# Patient Record
Sex: Female | Born: 1995
Health system: Southern US, Community
[De-identification: ages and names within clinical notes are randomized; demographics above are authoritative.]

---

## 2018-06-20 ENCOUNTER — Encounter (HOSPITAL_COMMUNITY): Payer: Self-pay | Admitting: Emergency Medicine

## 2018-06-20 ENCOUNTER — Emergency Department (HOSPITAL_COMMUNITY)
Admission: EM | Admit: 2018-06-20 | Discharge: 2018-06-20 | Disposition: A | Discharge status: Home / Self Care | Payer: Medicaid Other | Attending: Emergency Medicine | Admitting: Emergency Medicine

## 2018-06-20 ENCOUNTER — Emergency Department (HOSPITAL_COMMUNITY): Payer: Medicaid Other

## 2018-06-20 DIAGNOSIS — R102 Pelvic and perineal pain: Secondary | ICD-10-CM | POA: Insufficient documentation

## 2018-06-20 DIAGNOSIS — R1030 Lower abdominal pain, unspecified: Secondary | ICD-10-CM | POA: Diagnosis present

## 2018-06-20 LAB — URINALYSIS, ROUTINE W REFLEX MICROSCOPIC
Bilirubin Urine: NEGATIVE
GLUCOSE, UA: NEGATIVE mg/dL
KETONES UR: NEGATIVE mg/dL
Leukocytes, UA: NEGATIVE
NITRITE: NEGATIVE
PROTEIN: NEGATIVE mg/dL
Specific Gravity, Urine: 1.015 (ref 1.005–1.030)
pH: 7 (ref 5.0–8.0)

## 2018-06-20 LAB — WET PREP, GENITAL
CLUE CELLS WET PREP: NONE SEEN
SPERM: NONE SEEN
Trich, Wet Prep: NONE SEEN
WBC, Wet Prep HPF POC: NONE SEEN
Yeast Wet Prep HPF POC: NONE SEEN

## 2018-06-20 LAB — I-STAT CHEM 8, ED
BUN: 11 mg/dL (ref 6–20)
CALCIUM ION: 1.15 mmol/L (ref 1.15–1.40)
Chloride: 112 mmol/L — ABNORMAL HIGH (ref 98–111)
Creatinine, Ser: 0.7 mg/dL (ref 0.44–1.00)
Glucose, Bld: 103 mg/dL — ABNORMAL HIGH (ref 70–99)
HEMATOCRIT: 38 % (ref 36.0–46.0)
Hemoglobin: 12.9 g/dL (ref 12.0–15.0)
Potassium: 4 mmol/L (ref 3.5–5.1)
Sodium: 142 mmol/L (ref 135–145)
TCO2: 19 mmol/L — AB (ref 22–32)

## 2018-06-20 LAB — CBC
HCT: 37.9 % (ref 36.0–46.0)
HEMOGLOBIN: 12.8 g/dL (ref 12.0–15.0)
MCH: 27.6 pg (ref 26.0–34.0)
MCHC: 33.8 g/dL (ref 30.0–36.0)
MCV: 81.9 fL (ref 78.0–100.0)
Platelets: 367 10*3/uL (ref 150–400)
RBC: 4.63 MIL/uL (ref 3.87–5.11)
RDW: 15 % (ref 11.5–15.5)
WBC: 5.4 10*3/uL (ref 4.0–10.5)

## 2018-06-20 LAB — I-STAT BETA HCG BLOOD, ED (MC, WL, AP ONLY): I-stat hCG, quantitative: <5 m[IU]/mL (ref <5)

## 2018-06-20 MED ORDER — IOPAMIDOL (ISOVUE-300) INJECTION 61%
INTRAVENOUS | Status: AC
  Filled 2018-06-20: qty 100

## 2018-06-20 MED ORDER — IOPAMIDOL (ISOVUE-300) INJECTION 61%
100.0000 mL | Freq: Once | INTRAVENOUS | Status: AC | PRN
  Administered 2018-06-20: 100 mL via INTRAVENOUS

## 2018-06-20 MED ORDER — IBUPROFEN 600 MG PO TABS: 600.0000 mg | ORAL_TABLET | Freq: Three times a day (TID) | ORAL | 0 refills | Status: DC | PRN

## 2018-06-20 MED ORDER — ONDANSETRON 4 MG PO TBDP: 4.0000 mg | ORAL_TABLET | Freq: Three times a day (TID) | ORAL | 0 refills | Status: DC | PRN

## 2018-06-20 MED ORDER — KETOROLAC TROMETHAMINE 30 MG/ML IJ SOLN
15.0000 mg | Freq: Once | INTRAMUSCULAR | Status: AC
  Administered 2018-06-20: 15 mg via INTRAVENOUS
  Filled 2018-06-20: qty 1

## 2018-06-20 MED ORDER — ONDANSETRON HCL 4 MG/2ML IJ SOLN
4.0000 mg | Freq: Once | INTRAMUSCULAR | Status: AC
  Administered 2018-06-20: 4 mg via INTRAVENOUS
  Filled 2018-06-20: qty 2

## 2018-06-20 MED ORDER — FENTANYL CITRATE (PF) 100 MCG/2ML IJ SOLN
50.0000 ug | Freq: Once | INTRAMUSCULAR | Status: AC
  Administered 2018-06-20: 50 ug via INTRAVENOUS
  Filled 2018-06-20: qty 2

## 2018-06-20 MED ORDER — FENTANYL CITRATE (PF) 100 MCG/2ML IJ SOLN
100.0000 ug | Freq: Once | INTRAMUSCULAR | Status: AC
  Administered 2018-06-20: 100 ug via INTRAVENOUS
  Filled 2018-06-20: qty 2

## 2018-06-20 MED ORDER — MORPHINE SULFATE (PF) 4 MG/ML IV SOLN
4.0000 mg | Freq: Once | INTRAVENOUS | Status: AC
  Administered 2018-06-20: 4 mg via INTRAVENOUS
  Filled 2018-06-20: qty 1

## 2018-06-20 NOTE — ED Provider Notes (Signed)
[facility]-EMERGENCY DEPT Provider Note   CSN: [id number] Arrival date & time: 06/20/18  0755     History   Chief Complaint Chief Complaint  Patient presents with  . Abdominal Pain  . Emesis    HPI Kayla Oneill is a 22 y.o. female.  HPI Patient presents with abdominal pain and vomiting.  Lower abdominal pain somewhat severe over the last 4 days.  Worse today.  Has had nausea and vomiting and both diarrhea and constipation.  No fevers.  He has not had pains like this before.  No dysuria.  Pain is sharp.  Worse with movements.  Denies vaginal discharge.  Does not know if she is pregnant. History reviewed. No pertinent past medical history.  There are no active problems to display for this patient.   History reviewed. No pertinent surgical history.   OB History   None      Home Medications    Prior to Admission medications   Medication Sig Start Date End Date Taking? Authorizing Provider  ibuprofen (ADVIL,MOTRIN) 600 MG tablet Take 1 tablet (600 mg total) by mouth every 8 (eight) hours as needed. 06/20/18   Benjiman Core, MD  ondansetron (ZOFRAN-ODT) 4 MG disintegrating tablet Take 1 tablet (4 mg total) by mouth every 8 (eight) hours as needed for nausea or vomiting. 06/20/18   Benjiman Core, MD    Family History No family history on file.  Social History Social History   Tobacco Use  . Smoking status: Never Smoker  . Smokeless tobacco: Never Used  Substance Use Topics  . Alcohol use: Not on file  . Drug use: Not on file     Allergies   Patient has no known allergies.   Review of Systems Review of Systems  Constitutional: Positive for appetite change.  HENT: Negative for congestion.   Respiratory: Negative for shortness of breath.   Cardiovascular: Negative for chest pain.  Gastrointestinal: Positive for abdominal pain, constipation, diarrhea, nausea and vomiting.  Genitourinary: Negative for flank pain, vaginal discharge  and vaginal pain.  Musculoskeletal: Negative for back pain.  Skin: Negative for rash.  Neurological: Negative for tremors.  Hematological: Negative for adenopathy.  Psychiatric/Behavioral: Negative for confusion.     Physical Exam Updated Vital Signs BP 108/65 (BP Location: Left Arm)   Pulse 62   Temp 97.7 F (36.5 C) (Oral)   Resp 16   LMP 06/20/2018 Comment: neg hcg 06-20-18  SpO2 100%   Physical Exam  Constitutional: She appears well-developed.  Patient appears uncomfortable  HENT:  Head: Atraumatic.  Eyes: Pupils are equal, round, and reactive to light.  Cardiovascular: Regular rhythm.  Pulmonary/Chest: Effort normal.  Abdominal: Normal appearance.  Lower abdominal tenderness without mass.  No hernia palpated.  Moderate tenderness to  Neurological: She is alert.  Skin: Skin is warm. Capillary refill takes less than 2 seconds.  Pelvic exam showed some bloody discharge.  No clear adnexal tenderness but did have pain with palpation of the uterus.   ED Treatments / Results  Labs (all labs ordered are listed, but only abnormal results are displayed) Labs Reviewed  URINALYSIS, ROUTINE W REFLEX MICROSCOPIC - Abnormal; Notable for the following components:      Result Value   Hgb urine dipstick LARGE (*)    Bacteria, UA RARE (*)    All other components within normal limits  I-STAT CHEM 8, ED - Abnormal; Notable for the following components:   Chloride 112 (*)    Glucose, Bld 103 (*)  TCO2 19 (*)    All other components within normal limits  WET PREP, GENITAL  CBC  HIV ANTIBODY (ROUTINE TESTING)  RPR  I-STAT BETA HCG BLOOD, ED ([facility], [facility], [facility] ONLY)  GC/CHLAMYDIA PROBE AMP ([place]) NOT AT [facility]    EKG None  Radiology US Transvaginal Non-ob  Result Date: 06/20/2018 CLINICAL DATA:  Pelvic pain and pressure for 4 days. EXAM: TRANSABDOMINAL AND TRANSVAGINAL ULTRASOUND OF PELVIS DOPPLER ULTRASOUND OF OVARIES TECHNIQUE: Both transabdominal and transvaginal  ultrasound examinations of the pelvis were performed. Transabdominal technique was performed for global imaging of the pelvis including uterus, ovaries, adnexal regions, and pelvic cul-de-sac. It was necessary to proceed with endovaginal exam following the transabdominal exam to visualize the endometrium and adnexa. Color and duplex Doppler ultrasound was utilized to evaluate blood flow to the ovaries. COMPARISON:  Pelvic ultrasound 12/30/2013. FINDINGS: Uterus Measurements: 7.2 x 3.7 x 4.7 cm, within normal limits. No fibroids or other mass visualized. Endometrium Thickness: 8.6 mm, within normal limits. No focal abnormality visualized. Right ovary Measurements: 3.3 x 1.8 x 2.4 cm, within normal limits. Normal appearance/no adnexal mass. Left ovary Measurements: 3.2 x 1.6 x 2.2 cm, within normal limits. Normal appearance/no adnexal mass. Pulsed Doppler evaluation of both ovaries demonstrates normal low-resistance arterial and venous waveforms. Other findings No abnormal free fluid. IMPRESSION: 1. Negative pelvic ultrasound. No acute or focal lesion to explain the patient's pelvic pain or pressure. 2. Normal sonographic appearance of the ovaries including pulsed Doppler evaluation. Arterial and venous blood flow is within normal limits. Electronically Signed   By: Marin Roberts M.D.   On: 06/20/2018 12:42   US Pelvis Complete  Result Date: 06/20/2018 CLINICAL DATA:  Pelvic pain and pressure for 4 days. EXAM: TRANSABDOMINAL AND TRANSVAGINAL ULTRASOUND OF PELVIS DOPPLER ULTRASOUND OF OVARIES TECHNIQUE: Both transabdominal and transvaginal ultrasound examinations of the pelvis were performed. Transabdominal technique was performed for global imaging of the pelvis including uterus, ovaries, adnexal regions, and pelvic cul-de-sac. It was necessary to proceed with endovaginal exam following the transabdominal exam to visualize the endometrium and adnexa. Color and duplex Doppler ultrasound was utilized to  evaluate blood flow to the ovaries. COMPARISON:  Pelvic ultrasound 12/30/2013. FINDINGS: Uterus Measurements: 7.2 x 3.7 x 4.7 cm, within normal limits. No fibroids or other mass visualized. Endometrium Thickness: 8.6 mm, within normal limits. No focal abnormality visualized. Right ovary Measurements: 3.3 x 1.8 x 2.4 cm, within normal limits. Normal appearance/no adnexal mass. Left ovary Measurements: 3.2 x 1.6 x 2.2 cm, within normal limits. Normal appearance/no adnexal mass. Pulsed Doppler evaluation of both ovaries demonstrates normal low-resistance arterial and venous waveforms. Other findings No abnormal free fluid. IMPRESSION: 1. Negative pelvic ultrasound. No acute or focal lesion to explain the patient's pelvic pain or pressure. 2. Normal sonographic appearance of the ovaries including pulsed Doppler evaluation. Arterial and venous blood flow is within normal limits. Electronically Signed   By: Marin Roberts M.D.   On: 06/20/2018 12:42   Ct Abdomen Pelvis W Contrast  Result Date: 06/20/2018 CLINICAL DATA:  Lower abdominal pain for the past 4 days. EXAM: CT ABDOMEN AND PELVIS WITH CONTRAST TECHNIQUE: Multidetector CT imaging of the abdomen and pelvis was performed using the standard protocol following bolus administration of intravenous contrast. CONTRAST:  ISOVUE-300 IOPAMIDOL (ISOVUE-300) INJECTION 61% COMPARISON:  Pelvic ultrasound-earlier same day FINDINGS: Lower chest: Limited visualization of the lower thorax demonstrates minimal dependent subpleural ground-glass atelectasis. No discrete focal airspace opacities. No pleural effusion. Normal heart size.  No pericardial effusion. Hepatobiliary: Normal hepatic contour. There is a minimal amount of focal fatty infiltration adjacent to the fissure for the ligamentum teres. No discrete hepatic lesions. Normal appearance of the gallbladder given degree of distention. No radiopaque gallstones. No intra or extrahepatic biliary ductal dilatation.  No ascites. Pancreas: Normal appearance the pancreas Spleen: Normal appearance of the spleen Adrenals/Urinary Tract: There is symmetric enhancement of the bilateral kidneys. No definite renal stones this postcontrast examination. No discrete renal lesions. No urine obstruction or perinephric stranding. Normal appearance the bilateral adrenal glands. Normal appearance of the urinary bladder given degree distention. Stomach/Bowel: Moderate colonic stool burden without evidence of enteric obstruction. Normal appearance of the terminal ileum and appendix. No pneumoperitoneum, pneumatosis or portal venous gas. Vascular/Lymphatic: Normal caliber the abdominal aorta. The major branch vessels of the abdominal aorta appear patent on this non CTA examination. No bulky retroperitoneal, mesenteric, pelvic or inguinal lymphadenopathy. Reproductive: Normal appearance of the pelvic organs. No discrete adnexal lesion. No free fluid in the pelvic cul-de-sac. Other: Tiny mesenteric fat containing periumbilical hernia. Musculoskeletal: No acute or aggressive osseous abnormalities. IMPRESSION: No explanation for patient's lower abdominal pain. Specifically, no evidence of enteric or urinary obstruction. Normal appearance of the appendix. Electronically Signed   By: Simonne Come M.D.   On: 06/20/2018 14:00   [place] Art/ven Flow Abd Pelv Doppler  Result Date: 06/20/2018 CLINICAL DATA:  Pelvic pain and pressure for 4 days. EXAM: TRANSABDOMINAL AND TRANSVAGINAL ULTRASOUND OF PELVIS DOPPLER ULTRASOUND OF OVARIES TECHNIQUE: Both transabdominal and transvaginal ultrasound examinations of the pelvis were performed. Transabdominal technique was performed for global imaging of the pelvis including uterus, ovaries, adnexal regions, and pelvic cul-de-sac. It was necessary to proceed with endovaginal exam following the transabdominal exam to visualize the endometrium and adnexa. Color and duplex Doppler ultrasound was utilized to evaluate blood flow  to the ovaries. COMPARISON:  Pelvic ultrasound 12/30/2013. FINDINGS: Uterus Measurements: 7.2 x 3.7 x 4.7 cm, within normal limits. No fibroids or other mass visualized. Endometrium Thickness: 8.6 mm, within normal limits. No focal abnormality visualized. Right ovary Measurements: 3.3 x 1.8 x 2.4 cm, within normal limits. Normal appearance/no adnexal mass. Left ovary Measurements: 3.2 x 1.6 x 2.2 cm, within normal limits. Normal appearance/no adnexal mass. Pulsed Doppler evaluation of both ovaries demonstrates normal low-resistance arterial and venous waveforms. Other findings No abnormal free fluid. IMPRESSION: 1. Negative pelvic ultrasound. No acute or focal lesion to explain the patient's pelvic pain or pressure. 2. Normal sonographic appearance of the ovaries including pulsed Doppler evaluation. Arterial and venous blood flow is within normal limits. Electronically Signed   By: Marin Roberts M.D.   On: 06/20/2018 12:42    Procedures Procedures (including critical care time)  Medications Ordered in ED Medications  ondansetron (ZOFRAN) injection 4 mg (4 mg Intravenous Given 06/20/18 0830)  fentaNYL (SUBLIMAZE) injection 100 mcg (100 mcg Intravenous Given 06/20/18 0830)  morphine 4 MG/ML injection 4 mg (4 mg Intravenous Given 06/20/18 1202)  iopamidol (ISOVUE-300) 61 % injection 100 mL (100 mLs Intravenous Contrast Given 06/20/18 1341)  ketorolac (TORADOL) 30 MG/ML injection 15 mg (15 mg Intravenous Given 06/20/18 1429)  fentaNYL (SUBLIMAZE) injection 50 mcg (50 mcg Intravenous Given 06/20/18 1510)     Initial Impression / Assessment and Plan / ED Course  I have reviewed the triage vital signs and the nursing notes.  Pertinent labs & imaging results that were available during my care of the patient were reviewed by me and considered in my medical decision  making (see chart for details).     Patient with pelvic pain and abdominal pain.  Has been vomiting.  Work-up overall reassuring.   Initially nothing to eat then tolerated some orals.  May be just menstrual pain.  Ultrasound and CT scan reassuring.  Pelvic exam benign.  Discharge home.  Final Clinical Impressions(s) / ED Diagnoses   Final diagnoses:  Pelvic pain    ED Discharge Orders         Ordered    ondansetron (ZOFRAN-ODT) 4 MG disintegrating tablet  Every 8 hours PRN     06/20/18 1459    ibuprofen (ADVIL,MOTRIN) 600 MG tablet  Every 8 hours PRN     06/20/18 1459           Benjiman CorePickering, Racine Erby, MD 06/21/18 1554

## 2018-06-20 NOTE — ED Triage Notes (Signed)
Pt c/o lower abd pains x 4 days but worse the past 3 hours. Reports n/v.

## 2018-06-20 NOTE — ED Notes (Signed)
Pt requesting something to drink. Aware she is nothing by mouth at this time per MD Pickering.

## 2018-06-21 LAB — HIV ANTIBODY (ROUTINE TESTING W REFLEX): HIV SCREEN 4TH GENERATION: NONREACTIVE

## 2018-06-21 LAB — RPR: RPR Ser Ql: NONREACTIVE

## 2018-06-22 LAB — GC/CHLAMYDIA PROBE AMP (~~LOC~~) NOT AT ARMC
CHLAMYDIA, DNA PROBE: NEGATIVE
NEISSERIA GONORRHEA: NEGATIVE

## 2018-10-04 ENCOUNTER — Emergency Department (HOSPITAL_BASED_OUTPATIENT_CLINIC_OR_DEPARTMENT_OTHER)
Admission: EM | Admit: 2018-10-04 | Discharge: 2018-10-04 | Disposition: A | Discharge status: Home / Self Care | Payer: Medicaid Other | Attending: Emergency Medicine | Admitting: Emergency Medicine

## 2018-10-04 ENCOUNTER — Encounter (HOSPITAL_BASED_OUTPATIENT_CLINIC_OR_DEPARTMENT_OTHER): Payer: Self-pay | Admitting: *Deleted

## 2018-10-04 ENCOUNTER — Emergency Department (HOSPITAL_BASED_OUTPATIENT_CLINIC_OR_DEPARTMENT_OTHER): Payer: Medicaid Other

## 2018-10-04 ENCOUNTER — Other Ambulatory Visit: Payer: Self-pay

## 2018-10-04 DIAGNOSIS — J40 Bronchitis, not specified as acute or chronic: Secondary | ICD-10-CM | POA: Diagnosis not present

## 2018-10-04 DIAGNOSIS — J069 Acute upper respiratory infection, unspecified: Secondary | ICD-10-CM | POA: Insufficient documentation

## 2018-10-04 DIAGNOSIS — F17200 Nicotine dependence, unspecified, uncomplicated: Secondary | ICD-10-CM | POA: Insufficient documentation

## 2018-10-04 DIAGNOSIS — R05 Cough: Secondary | ICD-10-CM | POA: Diagnosis present

## 2018-10-04 MED ORDER — FLUTICASONE PROPIONATE 50 MCG/ACT NA SUSP: 1.0000 | Freq: Every day | NASAL | 0 refills | Status: DC

## 2018-10-04 MED ORDER — PREDNISONE 20 MG PO TABS: 40.0000 mg | ORAL_TABLET | Freq: Every day | ORAL | 0 refills | Status: DC

## 2018-10-04 MED ORDER — ONDANSETRON 4 MG PO TBDP: 4.0000 mg | ORAL_TABLET | Freq: Three times a day (TID) | ORAL | 0 refills | Status: DC | PRN

## 2018-10-04 MED ORDER — PREDNISONE 20 MG PO TABS: 40.0000 mg | ORAL_TABLET | Freq: Every day | ORAL | 0 refills | Status: AC

## 2018-10-04 MED ORDER — IBUPROFEN 600 MG PO TABS: 600.0000 mg | ORAL_TABLET | Freq: Three times a day (TID) | ORAL | 0 refills | Status: DC | PRN

## 2018-10-04 MED FILL — FLUTICASONE PROP 50 MCG SPR: 50 | 60 days supply | Qty: 16 | Fill #0

## 2018-10-04 MED FILL — predniSONE 20 MG TABS: 20 | 4 days supply | Qty: 8 | Fill #0

## 2018-10-04 NOTE — ED Triage Notes (Signed)
Dry cough and bodyaches x 1 week.

## 2018-10-04 NOTE — Discharge Instructions (Addendum)
You likely have a viral illness.  This should be treated symptomatically. Take prednisone daily. Use Tylenol or ibuprofen as needed for fevers or body aches. Use Flonase daily for nasal congestion and cough. Use over the counter cough drops and syrups as needed.  Make sure you stay well-hydrated with water. Wash your hands frequently to prevent spread of infection. Return to the emergency room if you develop chest pain, difficulty breathing, or any new or worsening symptoms.

## 2018-10-04 NOTE — ED Notes (Signed)
Patient transported to X-ray 

## 2018-10-04 NOTE — ED Provider Notes (Signed)
[facility] EMERGENCY DEPARTMENT Provider Note   CSN: [id number] Arrival date & time: 10/04/18  1154     History   Chief Complaint Chief Complaint  Patient presents with  . Cough    HPI Kayla Oneill is a 22 y.o. female presenting for evaluation of cough, sore throat, body aches.  Patient states for the past week, she has been having cough.  She has associated generalized body aches and sore throat.  She has nasal congestion and drainage. She reports chills without fevers.  Cough was nonproductive until today, when she started producing mucus with blood streaks.  She vomited once today.  She denies current nausea or vomiting.  She denies ear pain, chest pain, shortness of breath, abdominal pain, urinary symptoms, normal bowel movements.  She smoke cigarettes, denies history of asthma or COPD.  She denies sick contacts.  She has not taken anything for her symptoms including Tylenol, ibuprofen, or over the counter cough syrup.  HPI  History reviewed. No pertinent past medical history.  There are no active problems to display for this patient.   History reviewed. No pertinent surgical history.   OB History   No obstetric history on file.      Home Medications    Prior to Admission medications   Medication Sig Start Date End Date Taking? Authorizing Provider  fluticasone (FLONASE) 50 MCG/ACT nasal spray Place 1 spray into both nostrils daily. 10/04/18   Lisett Dirusso, PA-C  predniSONE (DELTASONE) 20 MG tablet Take 2 tablets (40 mg total) by mouth daily for 4 days. 10/04/18 10/08/18  Sheranda Seabrooks, PA-C    Family History No family history on file.  Social History Social History   Tobacco Use  . Smoking status: Current Every Day Smoker  . Smokeless tobacco: Never Used  Substance Use Topics  . Alcohol use: Not Currently  . Drug use: Never     Allergies   Patient has no known allergies.   Review of Systems Review of Systems  Constitutional:  Positive for chills.  HENT: Positive for congestion and sore throat.   Respiratory: Positive for cough.   Gastrointestinal: Positive for vomiting (x1, resolved).     Physical Exam Updated Vital Signs BP 113/79   Pulse 94   Temp 99.2 F (37.3 C) (Oral)   Resp 17   Ht 5\' 3"  (1.6 m)   Wt 78 kg   LMP 09/04/2018 (Within Days) Comment: she does not know the date of her LMP  SpO2 100%   BMI 30.47 kg/m   Physical Exam Vitals signs and nursing note reviewed.  Constitutional:      General: She is not in acute distress.    Appearance: She is well-developed.     Comments: Appears nontoxic  HENT:     Head: Normocephalic and atraumatic.     Comments: OP mildly erythematous without tonsillar swelling or exudate.  Uvula midline with palate rise.  TMs nonerythematous and a bulge bilaterally.  Uvula midline with palate rise. Nasal congestion noted    Right Ear: Tympanic membrane, ear canal and external ear normal.     Left Ear: Tympanic membrane, ear canal and external ear normal.     Nose: Mucosal edema and congestion present.     Right Sinus: No maxillary sinus tenderness or frontal sinus tenderness.     Left Sinus: No maxillary sinus tenderness or frontal sinus tenderness.     Mouth/Throat:     Pharynx: Uvula midline. Posterior oropharyngeal erythema present.  Tonsils: No tonsillar exudate.  Eyes:     Conjunctiva/sclera: Conjunctivae normal.     Pupils: Pupils are equal, round, and reactive to light.  Neck:     Musculoskeletal: Normal range of motion.  Cardiovascular:     Rate and Rhythm: Normal rate and regular rhythm.  Pulmonary:     Effort: Pulmonary effort is normal.     Breath sounds: Normal breath sounds. No decreased breath sounds, wheezing, rhonchi or rales.     Comments: Speaking in full sentences.  Clear lung sounds in all fields. Abdominal:     General: There is no distension.     Palpations: Abdomen is soft.     Tenderness: There is no abdominal tenderness.    Musculoskeletal: Normal range of motion.  Lymphadenopathy:     Cervical: No cervical adenopathy.  Skin:    General: Skin is warm.     Capillary Refill: Capillary refill takes less than 2 seconds.  Neurological:     Mental Status: She is alert and oriented to person, place, and time.      ED Treatments / Results  Labs (all labs ordered are listed, but only abnormal results are displayed) Labs Reviewed - No data to display  EKG None  Radiology Dg Chest 2 View  Result Date: 10/04/2018 CLINICAL DATA:  Cough for 1 week EXAM: CHEST - 2 VIEW COMPARISON:  None. FINDINGS: Lungs are clear. Heart size and pulmonary vascularity are normal. No adenopathy. No pneumothorax. No bone lesions. IMPRESSION: No edema or consolidation. Electronically Signed   By: Bretta Bang III M.D.   On: 10/04/2018 13:15    Procedures Procedures (including critical care time)  Medications Ordered in ED Medications - No data to display   Initial Impression / Assessment and Plan / ED Course  I have reviewed the triage vital signs and the nursing notes.  Pertinent labs & imaging results that were available during my care of the patient were reviewed by me and considered in my medical decision making (see chart for details).     Patient presenting with 1 wk h/o URI symptoms.  Physical exam reassuring, patient is afebrile and appears nontoxic.  Pulmonary exam reassuring.  However, as her symptoms worsened today/changed today, will obtain x-ray for further evaluation.  X-ray viewed interpreted by me, no pneumonia, pneumothorax, effusion, or cardiomegaly.  Likely viral URI, consider bronchitis.  Will treat with prednisone, Flonase, and over-the-counter cough syrups.  At this time, patient appears safe for discharge.  Return precautions given.  Patient states she understands agrees plan.  Final Clinical Impressions(s) / ED Diagnoses   Final diagnoses:  Upper respiratory tract infection, unspecified type   Bronchitis    ED Discharge Orders         Ordered    fluticasone (FLONASE) 50 MCG/ACT nasal spray  Daily,   Status:  Discontinued     10/04/18 1340    predniSONE (DELTASONE) 20 MG tablet  Daily,   Status:  Discontinued     10/04/18 1340    ibuprofen (ADVIL,MOTRIN) 600 MG tablet  Every 8 hours PRN,   Status:  Discontinued     10/04/18 1358    ondansetron (ZOFRAN-ODT) 4 MG disintegrating tablet  Every 8 hours PRN,   Status:  Discontinued     10/04/18 1358    predniSONE (DELTASONE) 20 MG tablet  Daily     10/04/18 1359    fluticasone (FLONASE) 50 MCG/ACT nasal spray  Daily     10/04/18 1359  Alveria ApleyCaccavale, Rosie Torrez, PA-C 10/04/18 1607    Vanetta MuldersZackowski, Scott, MD 10/05/18 [phone]

## 2018-10-18 NOTE — Progress Notes (Deleted)
Patient ID: Verdell FaceJada Oneill, female   DOB: 11-Jul-1996, 22 y.o.   MRN: [id number]  After being seen in the ED 10/04/2018 for URI.  She was treated with prednisone, flonase, and advised to take OTC cold meds.

## 2018-10-19 ENCOUNTER — Inpatient Hospital Stay: Payer: Medicaid Other

## 2019-09-27 IMAGING — US US ART/VEN ABD/PELV/SCROTUM DOPPLER LTD
1 series · 13 of 25 positions shown · non-contrast
Comparison: Pelvic ultrasound 12/30/2013.

CLINICAL DATA: Pelvic pain and pressure for 4 days.

EXAM:
TRANSABDOMINAL AND TRANSVAGINAL ULTRASOUND OF PELVIS
DOPPLER ULTRASOUND OF OVARIES
TECHNIQUE: Both transabdominal and transvaginal ultrasound examinations of the
pelvis were performed. Transabdominal technique was performed for
global imaging of the pelvis including uterus, ovaries, adnexal
regions, and pelvic cul-de-sac.
It was necessary to proceed with endovaginal exam following the
transabdominal exam to visualize the endometrium and adnexa. Color
and duplex Doppler ultrasound was utilized to evaluate blood flow to
the ovaries.

[Series 1: us art/ven abd/pelv/scrotum doppler ltd · 0.20mm/px · 13 of 78 slices shown]
[im 1/78]
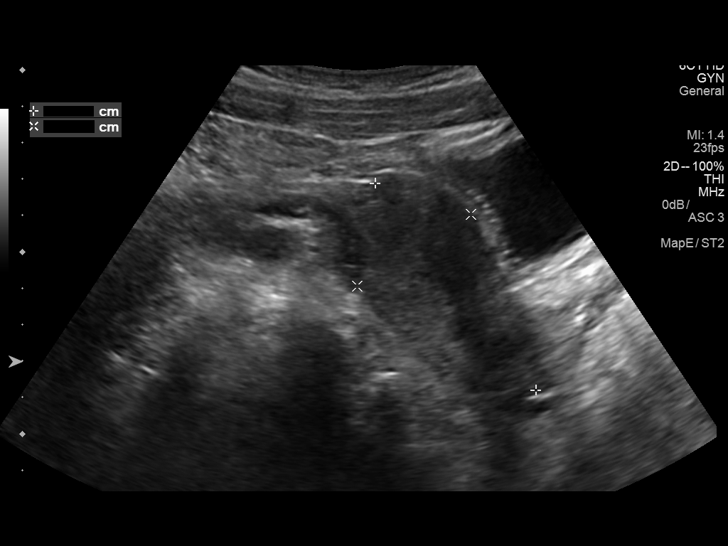
[im 7/78]
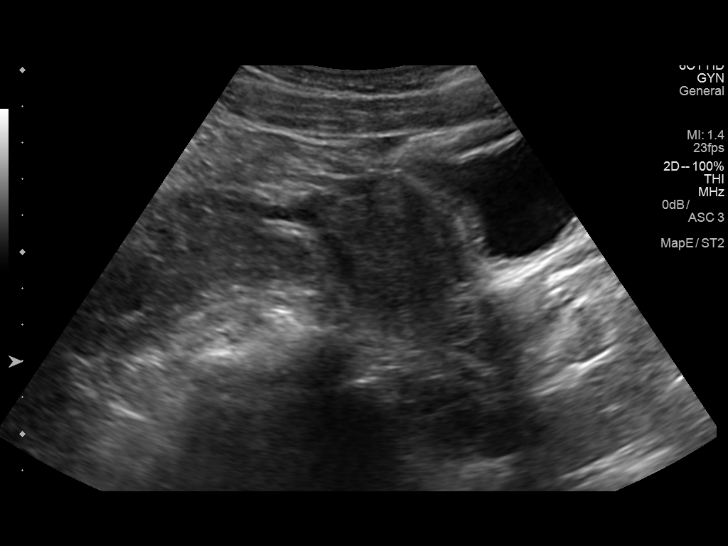
[im 13/78]
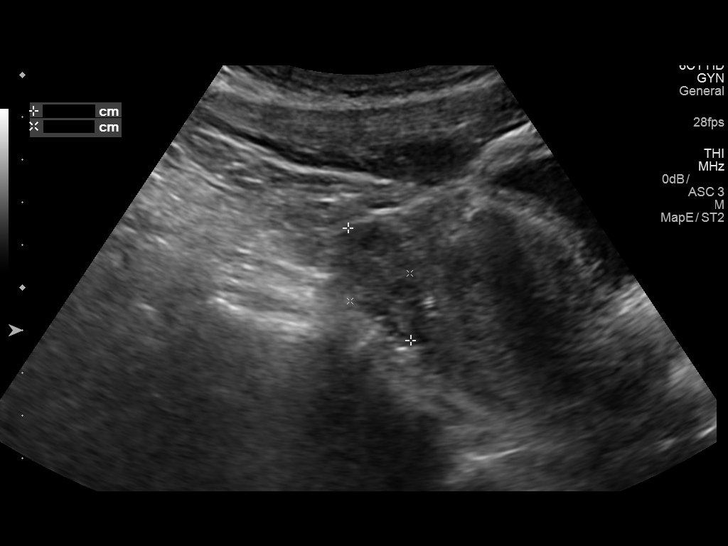
[im 20/78]
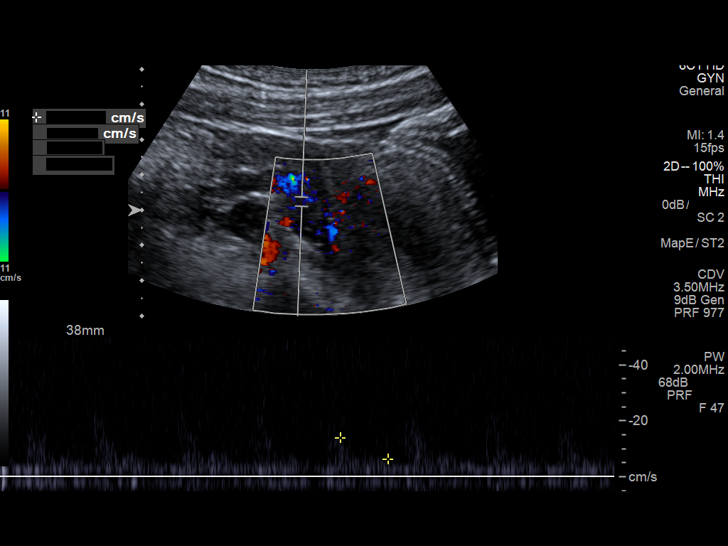
[im 26/78]
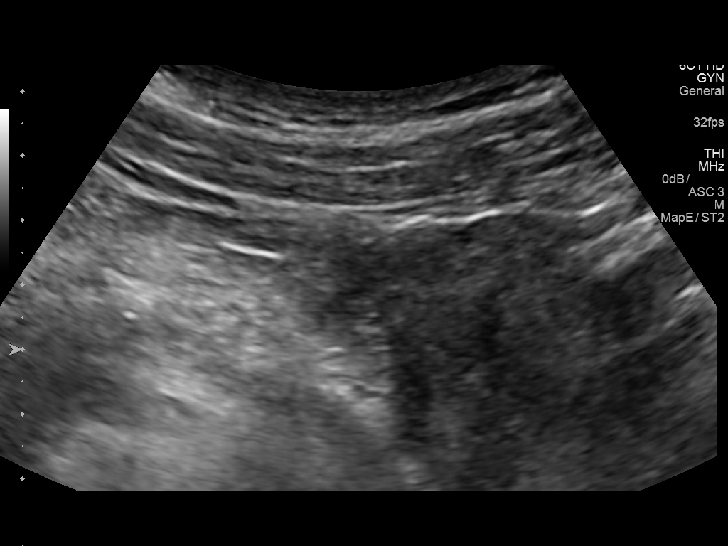
[im 33/78]
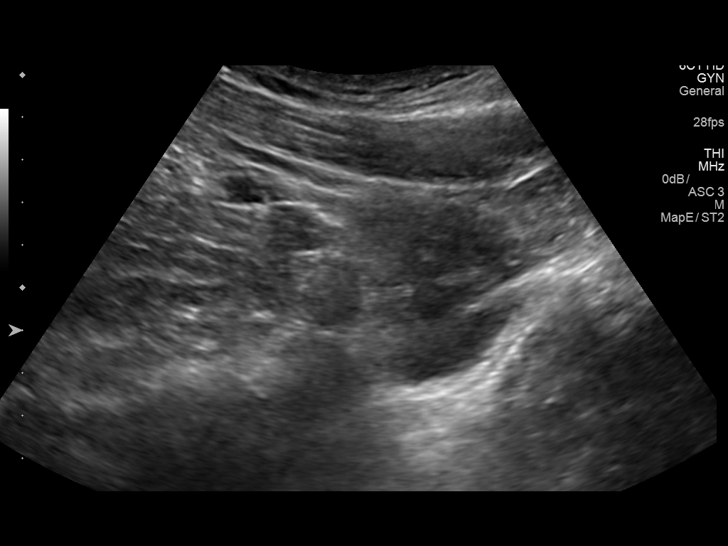
[im 39/78]
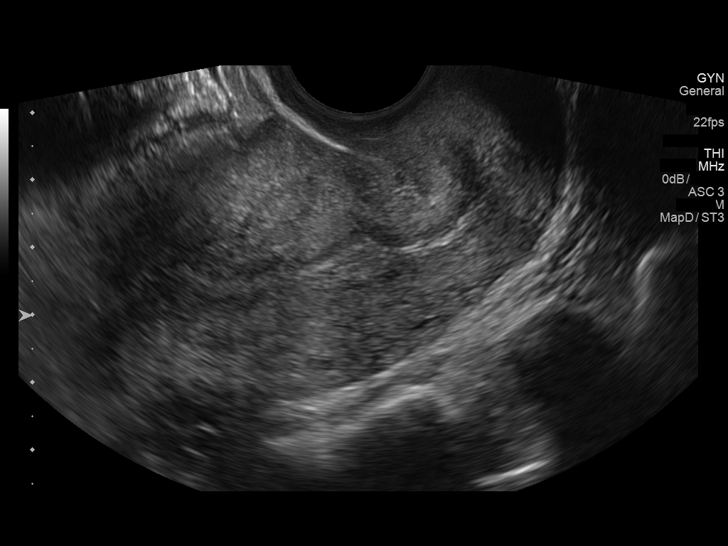
[im 45/78]
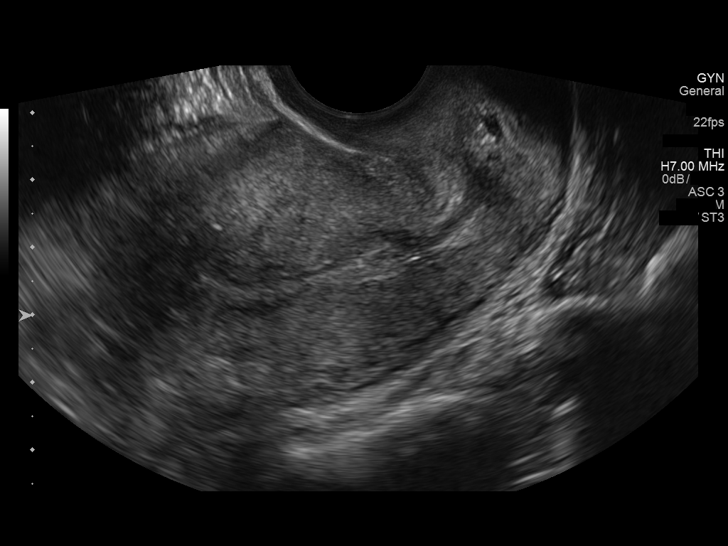
[im 52/78]
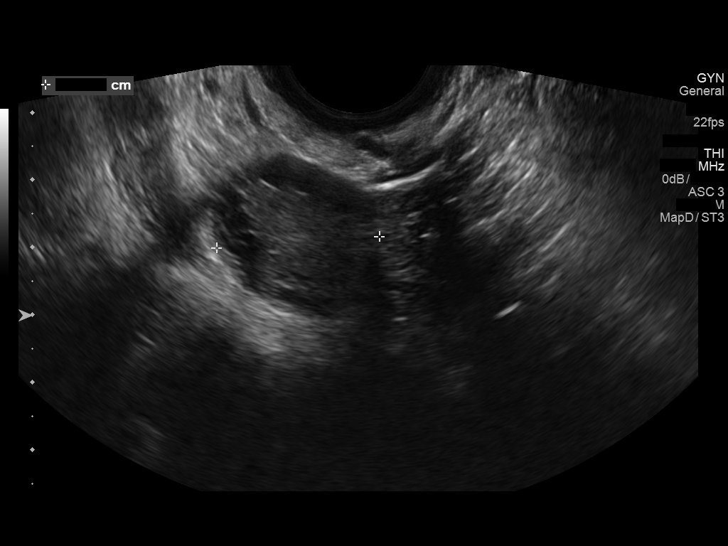
[im 58/78]
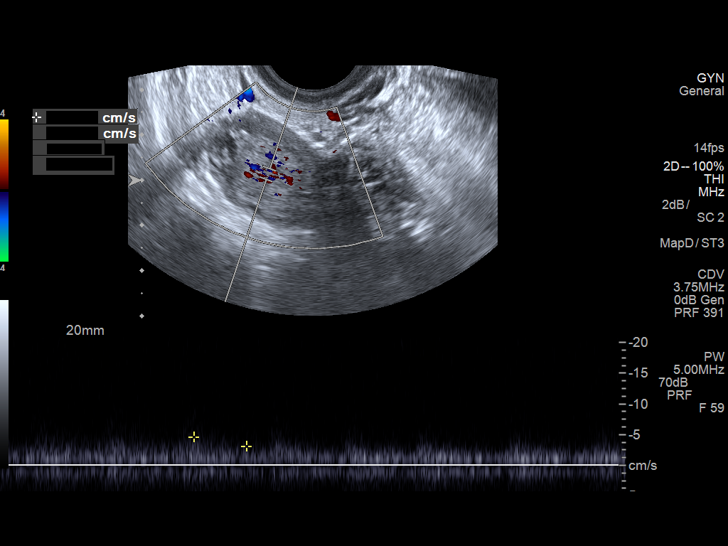
[im 65/78]
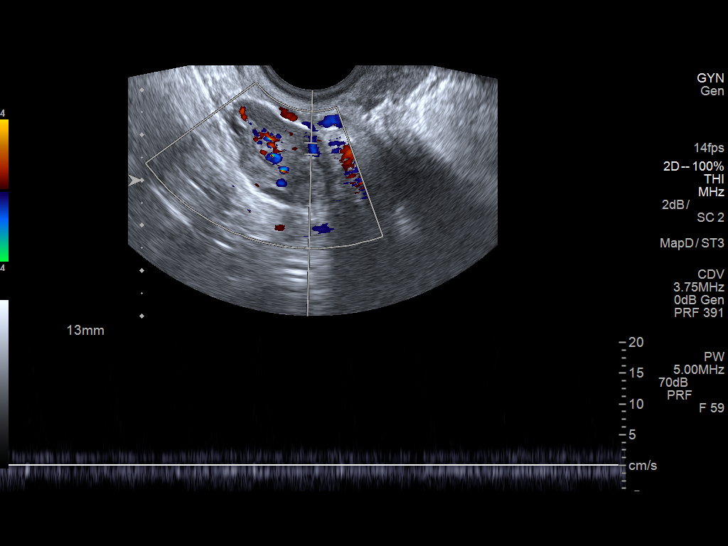
[im 71/78]
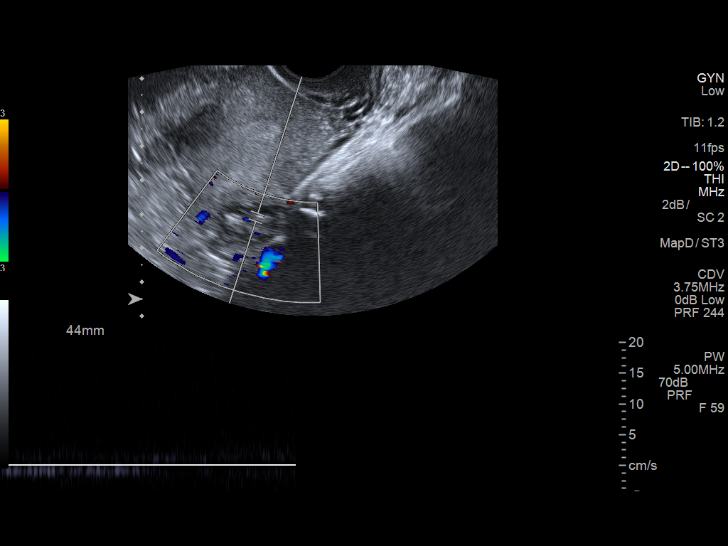
[im 78/78]
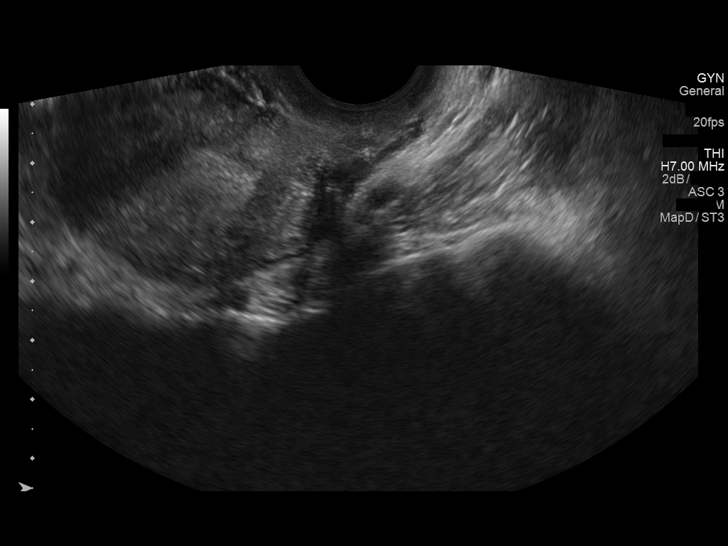

[13 of 25 positions shown; findings below may reference images not displayed]

FINDINGS: Uterus

Measurements: 7.2 x 3.7 x 4.7 cm, within normal limits. No fibroids
or other mass visualized.

Endometrium

Thickness: 8.6 mm, within normal limits. No focal abnormality
visualized.

Right ovary

Measurements: 3.3 x 1.8 x 2.4 cm, within normal limits. Normal
appearance/no adnexal mass.

Left ovary

Measurements: 3.2 x 1.6 x 2.2 cm, within normal limits. Normal
appearance/no adnexal mass.

Pulsed Doppler evaluation of both ovaries demonstrates normal
low-resistance arterial and venous waveforms.

Other findings

No abnormal free fluid.
IMPRESSION: 1. Negative pelvic ultrasound. No acute or focal lesion to explain
the patient's pelvic pain or pressure.
2. Normal sonographic appearance of the ovaries including pulsed
Doppler evaluation. Arterial and venous blood flow is within normal
limits.

## 2020-12-02 IMAGING — CR DG CHEST 2V
2 series · 2 of 2 positions shown · non-contrast
Comparison: None.

CLINICAL DATA: Cough for 1 week

EXAM:
CHEST - 2 VIEW

[w chest pa]
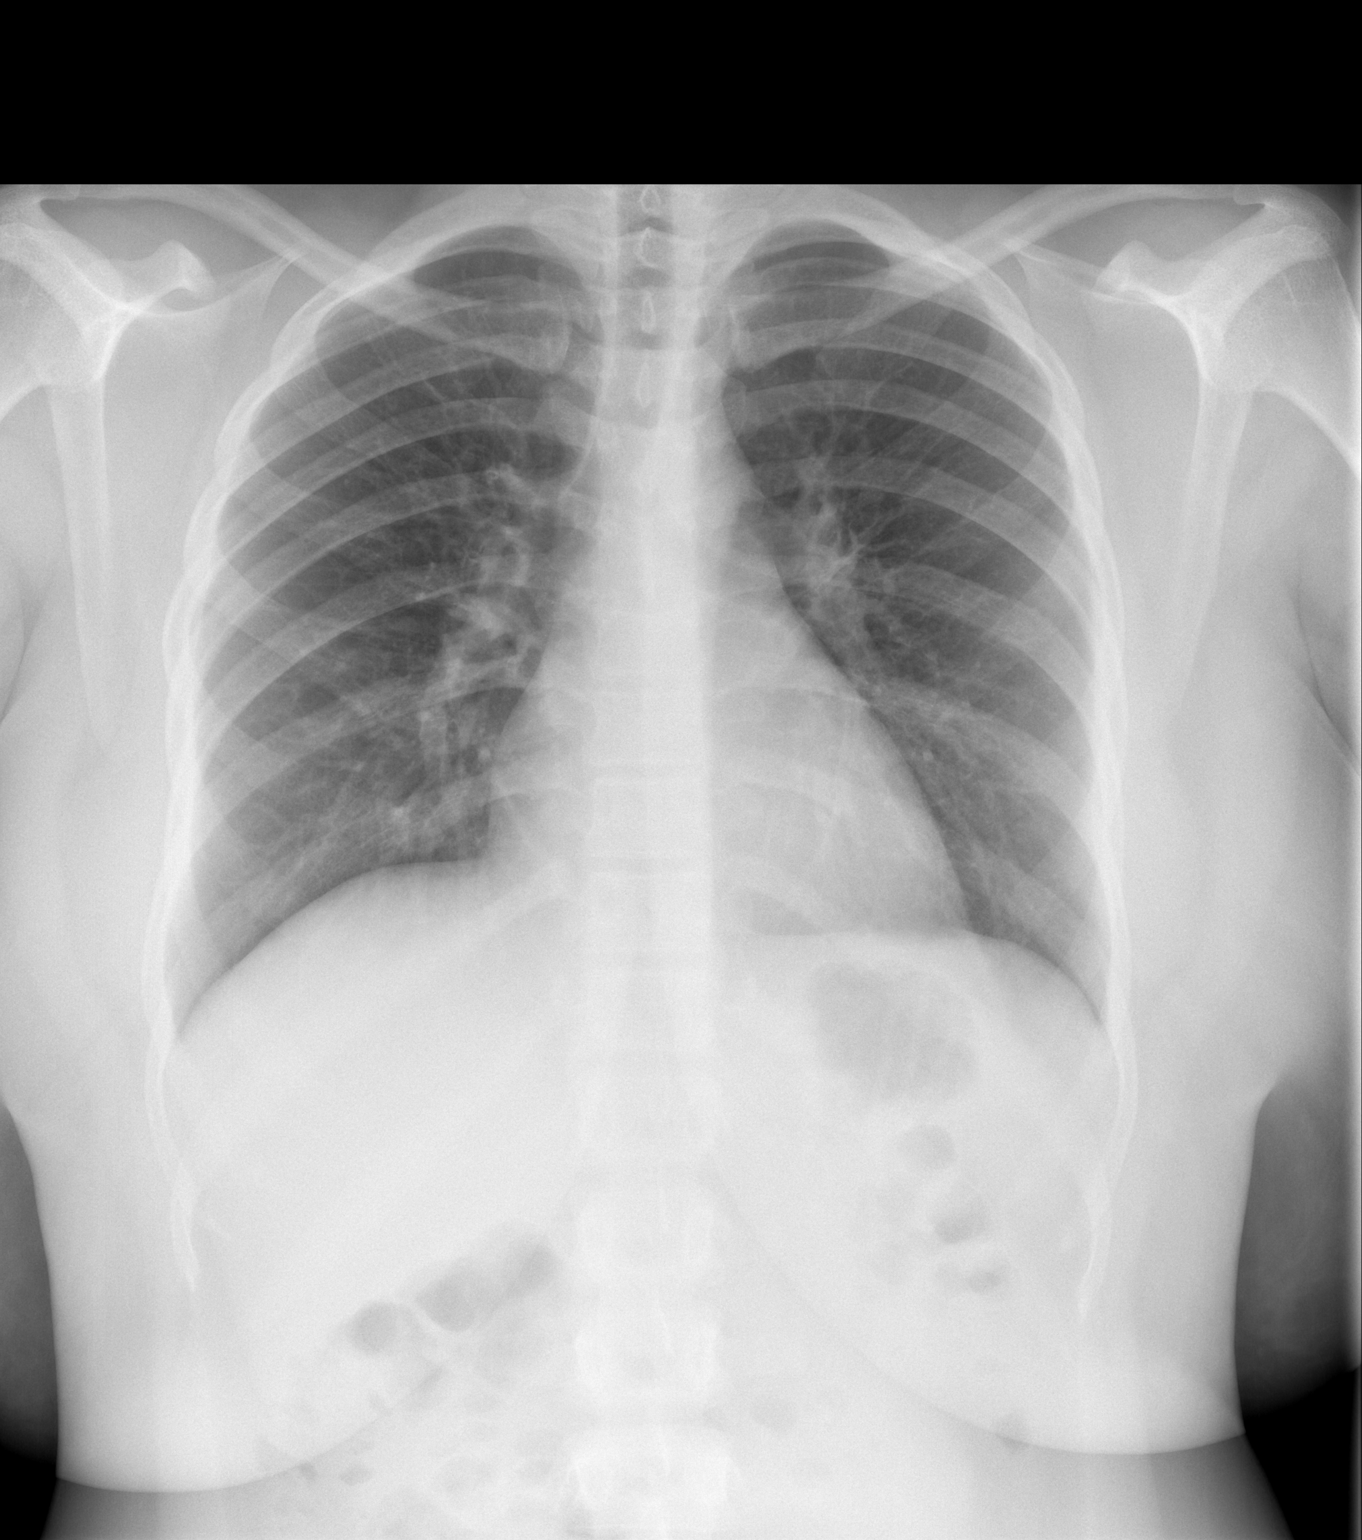

[w chest lat]
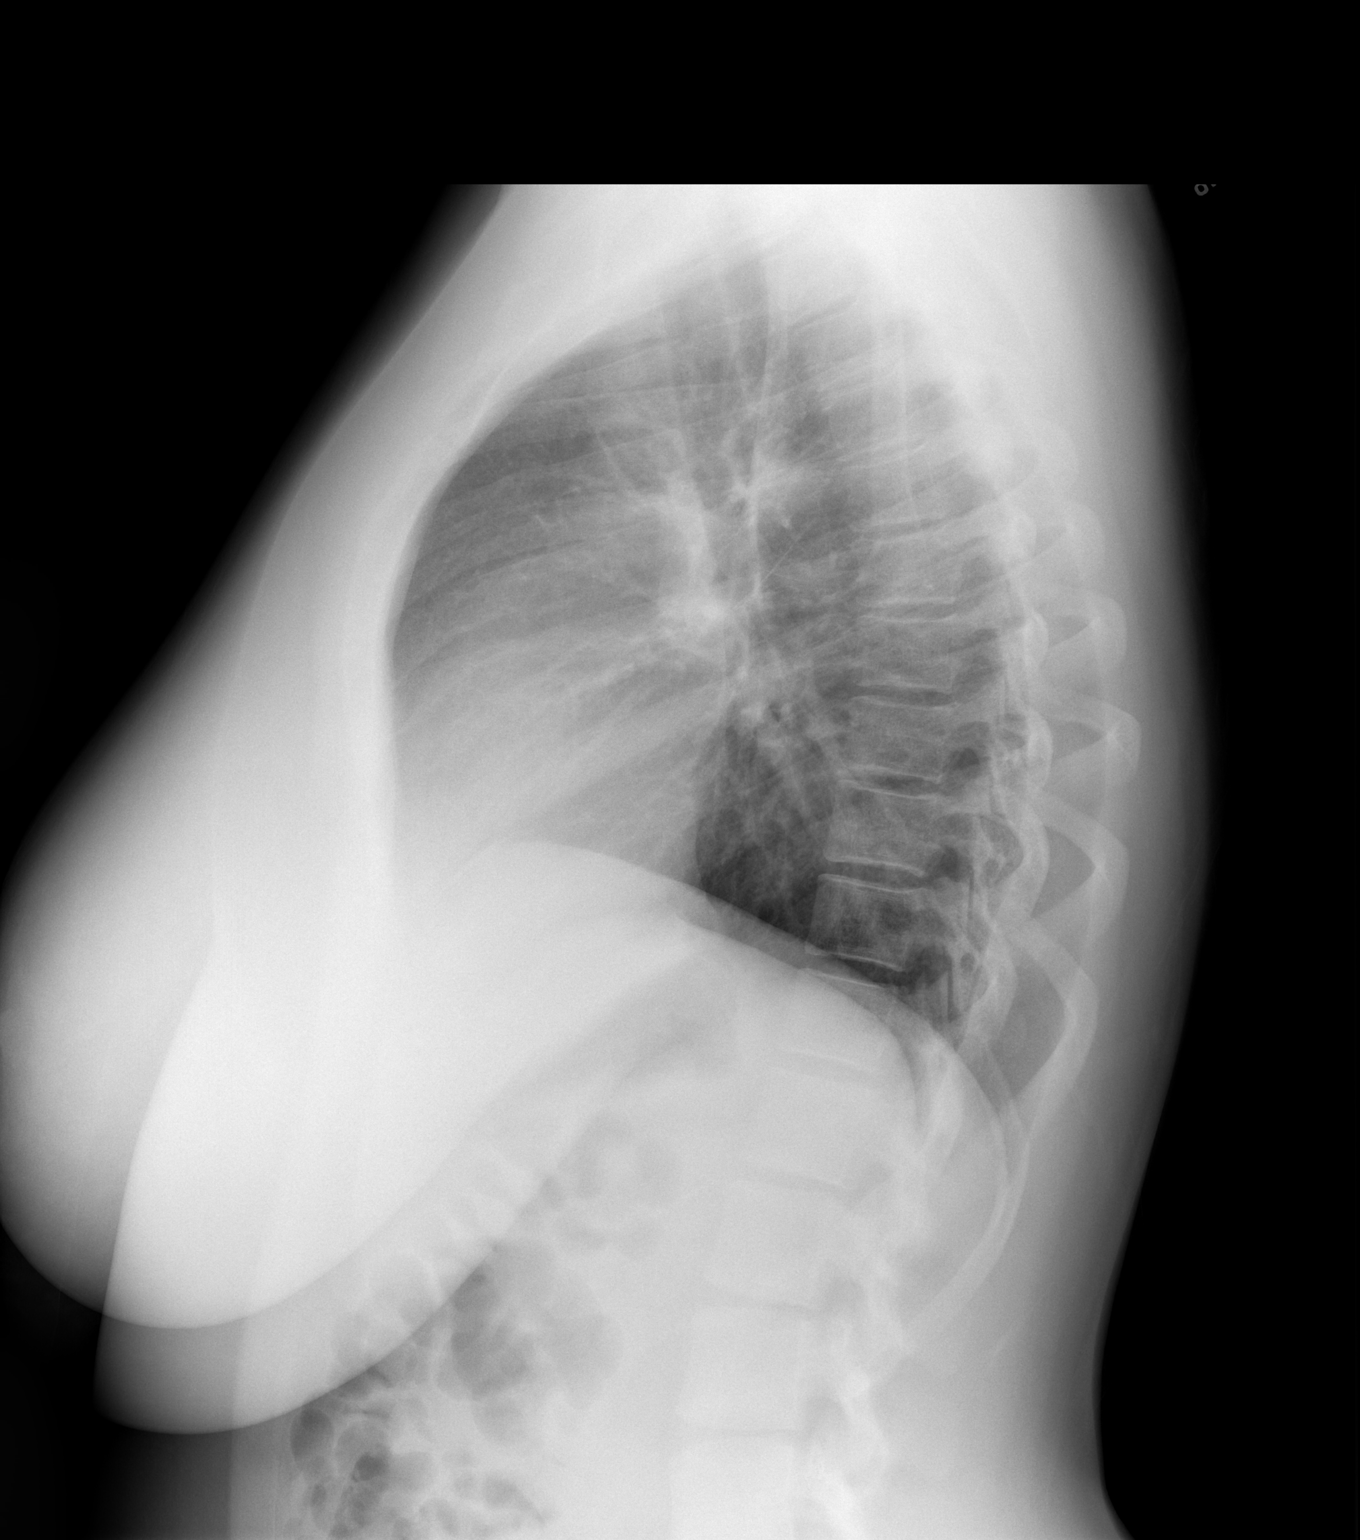

[2 of 2 positions shown; findings below may reference images not displayed]

FINDINGS: Lungs are clear. Heart size and pulmonary vascularity are normal. No
adenopathy. No pneumothorax. No bone lesions.
IMPRESSION: No edema or consolidation.

## 2021-03-16 ENCOUNTER — Emergency Department (HOSPITAL_BASED_OUTPATIENT_CLINIC_OR_DEPARTMENT_OTHER)
Admission: EM | Admit: 2021-03-16 | Discharge: 2021-03-16 | Disposition: A | Discharge status: Home / Self Care | Payer: Medicaid Other | Attending: Emergency Medicine | Admitting: Emergency Medicine

## 2021-03-16 ENCOUNTER — Encounter (HOSPITAL_BASED_OUTPATIENT_CLINIC_OR_DEPARTMENT_OTHER): Payer: Self-pay

## 2021-03-16 ENCOUNTER — Other Ambulatory Visit: Payer: Self-pay

## 2021-03-16 DIAGNOSIS — Z87891 Personal history of nicotine dependence: Secondary | ICD-10-CM | POA: Insufficient documentation

## 2021-03-16 DIAGNOSIS — U071 COVID-19: Secondary | ICD-10-CM

## 2021-03-16 LAB — RESP PANEL BY RT-PCR (FLU A&B, COVID) ARPGX2
Influenza A by PCR: NEGATIVE
Influenza B by PCR: NEGATIVE
SARS Coronavirus 2 by RT PCR: POSITIVE — AB

## 2021-03-16 LAB — GROUP A STREP BY PCR: Group A Strep by PCR: NOT DETECTED

## 2021-03-16 MED ORDER — ACETAMINOPHEN 160 MG/5ML PO SOLN
650.0000 mg | Freq: Once | ORAL | Status: AC
  Administered 2021-03-16: 650 mg via ORAL
  Filled 2021-03-16 (×2): qty 20.3

## 2021-03-16 NOTE — ED Triage Notes (Signed)
Pt c/o flu like sx x 4-5 days-NAD-steady gait

## 2021-03-16 NOTE — Discharge Instructions (Signed)
You tested positive for COVID-19.  This is likely the cause of your fevers, body aches, nausea, diminished appetite, headache, and fatigue.  Please check your temperature regularly and take Tylenol as needed for fever control.  I recommend you increase oral hydration and eat meals regularly.  I would like for you to maintain isolation precautions for another 4 days before returning to work.  When you return to work, please wear a mask for an additional 3 to 5 days.  Follow-up with a primary care provider for ongoing evaluation and management.  If you do not have one, please get established with one as soon as possible.  Return to the ED or seek immediate medical attention should you experience any new or worsening symptoms.

## 2021-03-16 NOTE — ED Provider Notes (Signed)
[facility] EMERGENCY DEPARTMENT Provider Note   CSN: [id number] Arrival date & time: 03/16/21  1601     History Chief Complaint  Patient presents with  . Cough    Marylouise Mallet is a 25 y.o. female with no past medical history presents the ED with complaints of cold symptoms.  Patient reports that 3 days ago she developed a headache with subjective fevers and chills.  She has felt particularly fatigued over the course of the past few days and with diminished appetite.  She had 1 episode of nausea and vomiting.  She is also now endorsing generalized body aches, most notably in her legs.  She denies any obvious sick contacts, chest pain or difficulty breathing, cough, congestion, abdominal pain, sore throat, difficulty eating or drinking, continued nausea symptoms, dysuria or increased urinary frequency, or changes in her bowel habits.  She states that she has essentially been sleeping at home the past couple of days and now is finally feeling improved to the point that she can come to the ED for evaluation.  She had not been checking her temperature regularly or taking any medications.  She tells me that she is ready to go home as soon as I had entered the room.  HPI     History reviewed. No pertinent past medical history.  There are no problems to display for this patient.   History reviewed. No pertinent surgical history.   OB History   No obstetric history on file.     No family history on file.  Social History   Tobacco Use  . Smoking status: Former Games developer  . Smokeless tobacco: Never Used  Vaping Use  . Vaping Use: Never used  Substance Use Topics  . Alcohol use: Yes    Comment: occ  . Drug use: Never    Home Medications Prior to Admission medications   Not on File    Allergies    Patient has no known allergies.  Review of Systems   Review of Systems  All other systems reviewed and are negative.   Physical Exam Updated Vital Signs BP  126/89 (BP Location: Left Arm)   Pulse 90   Temp (!) 100.4 F (38 C) (Oral)   Resp 20   Ht 5\' 4"  (1.626 m)   LMP 02/20/2021   SpO2 100%   BMI 29.52 kg/m   Physical Exam Vitals and nursing note reviewed. Exam conducted with a chaperone present.  Constitutional:      General: She is not in acute distress.    Appearance: Normal appearance. She is ill-appearing. She is not toxic-appearing.  HENT:     Head: Normocephalic and atraumatic.     Mouth/Throat:     Pharynx: Oropharynx is clear. No oropharyngeal exudate or posterior oropharyngeal erythema.  Eyes:     General: No scleral icterus.    Conjunctiva/sclera: Conjunctivae normal.  Cardiovascular:     Rate and Rhythm: Normal rate.     Pulses: Normal pulses.  Pulmonary:     Effort: Pulmonary effort is normal. No respiratory distress.     Breath sounds: Normal breath sounds.     Comments: CTA bilaterally.  No increased work of breathing.  100% on room air. Abdominal:     General: Abdomen is flat. There is no distension.     Palpations: Abdomen is soft.     Tenderness: There is abdominal tenderness.     Comments: Patient with mild generalized abdominal tenderness to palpation, nonspecific.  Nonfocal.  Soft, nondistended.  No peritoneal signs.  No guarding.  Musculoskeletal:        General: Normal range of motion.     Right lower leg: No edema.     Left lower leg: No edema.  Skin:    General: Skin is dry.  Neurological:     Mental Status: She is alert and oriented to person, place, and time.     GCS: GCS eye subscore is 4. GCS verbal subscore is 5. GCS motor subscore is 6.  Psychiatric:        Mood and Affect: Mood normal.        Behavior: Behavior normal.        Thought Content: Thought content normal.     ED Results / Procedures / Treatments   Labs (all labs ordered are listed, but only abnormal results are displayed) Labs Reviewed  RESP PANEL BY RT-PCR (FLU A&B, COVID) ARPGX2 - Abnormal; Notable for the following  components:      Result Value   SARS Coronavirus 2 by RT PCR POSITIVE (*)    All other components within normal limits  GROUP A STREP BY PCR    EKG None  Radiology No results found.  Procedures Procedures   Medications Ordered in ED Medications  acetaminophen (TYLENOL) 160 MG/5ML solution 650 mg (650 mg Oral Given 03/16/21 1626)    ED Course  I have reviewed the triage vital signs and the nursing notes.  Pertinent labs & imaging results that were available during my care of the patient were reviewed by me and considered in my medical decision making (see chart for details).    MDM Rules/Calculators/A&P                          Zsofia Prout was evaluated in Emergency Department on 03/16/2021 for the symptoms described in the history of present illness. She was evaluated in the context of the global COVID-19 pandemic, which necessitated consideration that the patient might be at risk for infection with the SARS-CoV-2 virus that causes COVID-19. Institutional protocols and algorithms that pertain to the evaluation of patients at risk for COVID-19 are in a state of rapid change based on information released by regulatory bodies including the CDC and federal and state organizations. These policies and algorithms were followed during the patient's care in the ED.  I personally reviewed patient's medical chart and all notes from triage and staff during today's encounter. I have also ordered and reviewed all labs and imaging that I felt to be medically necessary in the evaluation of this patient's complaints and with consideration of their physical exam. If needed, translation services were available and utilized.   Patient with symptoms consistent with viral illness for 4 days.  COVID-19 and influenza testing is obtained and pending.  Given brevity of illness and reassuring physical exam, do not feel as though imaging of chest is warranted.  Their symptoms are likely of viral etiology  and we discussed that antibiotics are not indicated for viral infections.  Patient will be discharged with symptomatic treatment.  Patient is tolerating food and liquid without difficulty and I do not believe that laboratory work-up would yield any significant findings.  Low suspicion for electrolyte derangement, but emphasized the importance of eating regularly.  I also emphasized the importance of rest, continued oral hydration, and antipyretics as needed for fever control.   Patient was positive for COVID-19.  She is without any significant risk  factors for poor outcomes.  She does not meet criteria for COVID-19 treatment.  She can follow-up with primary care for ongoing evaluation and management.  ER return precautions discussed.  They were provided opportunity to ask any additional questions and have none at this time.  Prior to discharge patient is feeling well, agreeable with plan for discharge home.  They have expressed understanding of verbal discharge instructions as well as return precautions and are agreeable to the plan.    Final Clinical Impression(s) / ED Diagnoses Final diagnoses:  COVID-19    Rx / DC Orders ED Discharge Orders    None       Lorelee New, PA-C 03/16/21 1723    Pollyann Savoy, MD 03/16/21 2224

## 2021-03-17 ENCOUNTER — Telehealth: Payer: Self-pay | Admitting: Infectious Diseases

## 2021-03-17 NOTE — Telephone Encounter (Signed)
Called to discuss with patient about COVID-19 symptoms and the use of one of the available treatments for those with mild to moderate Covid symptoms and at a high risk of hospitalization.  Pt appears to qualify for outpatient treatment due to co-morbid conditions and/or a member of an at-risk group in accordance with the FDA Emergency Use Authorization.    Symptom onset: 5/21 Vaccinated: unknown Booster?  Immunocompromised? no Qualifiers: BMI 29 NIH Criteria: 2  Unable to reach pt - unable to LVM. Mychart sent    Rexene Alberts

## 2021-05-15 ENCOUNTER — Emergency Department (HOSPITAL_BASED_OUTPATIENT_CLINIC_OR_DEPARTMENT_OTHER)
Admission: EM | Admit: 2021-05-15 | Discharge: 2021-05-15 | Disposition: A | Discharge status: Home / Self Care | Payer: Medicaid Other | Attending: Emergency Medicine | Admitting: Emergency Medicine

## 2021-05-15 ENCOUNTER — Encounter (HOSPITAL_BASED_OUTPATIENT_CLINIC_OR_DEPARTMENT_OTHER): Payer: Self-pay

## 2021-05-15 ENCOUNTER — Other Ambulatory Visit: Payer: Self-pay

## 2021-05-15 ENCOUNTER — Emergency Department (HOSPITAL_BASED_OUTPATIENT_CLINIC_OR_DEPARTMENT_OTHER): Payer: Medicaid Other

## 2021-05-15 DIAGNOSIS — N9489 Other specified conditions associated with female genital organs and menstrual cycle: Secondary | ICD-10-CM | POA: Insufficient documentation

## 2021-05-15 DIAGNOSIS — R102 Pelvic and perineal pain: Secondary | ICD-10-CM

## 2021-05-15 DIAGNOSIS — O26899 Other specified pregnancy related conditions, unspecified trimester: Secondary | ICD-10-CM

## 2021-05-15 DIAGNOSIS — Z87891 Personal history of nicotine dependence: Secondary | ICD-10-CM | POA: Diagnosis not present

## 2021-05-15 DIAGNOSIS — R109 Unspecified abdominal pain: Secondary | ICD-10-CM

## 2021-05-15 DIAGNOSIS — O219 Vomiting of pregnancy, unspecified: Secondary | ICD-10-CM | POA: Diagnosis not present

## 2021-05-15 DIAGNOSIS — Z3A01 Less than 8 weeks gestation of pregnancy: Secondary | ICD-10-CM | POA: Diagnosis not present

## 2021-05-15 DIAGNOSIS — O26891 Other specified pregnancy related conditions, first trimester: Secondary | ICD-10-CM | POA: Insufficient documentation

## 2021-05-15 DIAGNOSIS — N760 Acute vaginitis: Secondary | ICD-10-CM | POA: Diagnosis not present

## 2021-05-15 DIAGNOSIS — B9689 Other specified bacterial agents as the cause of diseases classified elsewhere: Secondary | ICD-10-CM | POA: Diagnosis not present

## 2021-05-15 LAB — URINALYSIS, ROUTINE W REFLEX MICROSCOPIC
Bilirubin Urine: NEGATIVE
Glucose, UA: NEGATIVE mg/dL
Hgb urine dipstick: NEGATIVE
Ketones, ur: >80 mg/dL — AB
Leukocytes,Ua: NEGATIVE
Nitrite: NEGATIVE
Protein, ur: NEGATIVE mg/dL
Specific Gravity, Urine: 1.02 (ref 1.005–1.030)
pH: 6.5 (ref 5.0–8.0)

## 2021-05-15 LAB — CBC WITH DIFFERENTIAL/PLATELET
Abs Immature Granulocytes: 0.01 10*3/uL (ref 0.00–0.07)
Basophils Absolute: 0 10*3/uL (ref 0.0–0.1)
Basophils Relative: 1 %
Eosinophils Absolute: 0.1 10*3/uL (ref 0.0–0.5)
Eosinophils Relative: 2 %
HCT: 40.8 % (ref 36.0–46.0)
Hemoglobin: 14 g/dL (ref 12.0–15.0)
Immature Granulocytes: 0 %
Lymphocytes Relative: 37 %
Lymphs Abs: 2.3 10*3/uL (ref 0.7–4.0)
MCH: 27.8 pg (ref 26.0–34.0)
MCHC: 34.3 g/dL (ref 30.0–36.0)
MCV: 81.1 fL (ref 80.0–100.0)
Monocytes Absolute: 0.5 10*3/uL (ref 0.1–1.0)
Monocytes Relative: 8 %
Neutro Abs: 3.2 10*3/uL (ref 1.7–7.7)
Neutrophils Relative %: 52 %
Platelets: 408 10*3/uL — ABNORMAL HIGH (ref 150–400)
RBC: 5.03 MIL/uL (ref 3.87–5.11)
RDW: 14.6 % (ref 11.5–15.5)
WBC: 6.1 10*3/uL (ref 4.0–10.5)
nRBC: 0 % (ref 0.0–0.2)

## 2021-05-15 LAB — WET PREP, GENITAL
Sperm: NONE SEEN
Trich, Wet Prep: NONE SEEN
Yeast Wet Prep HPF POC: NONE SEEN

## 2021-05-15 LAB — COMPREHENSIVE METABOLIC PANEL
ALT: 13 U/L (ref 0–44)
AST: 18 U/L (ref 15–41)
Albumin: 4.3 g/dL (ref 3.5–5.0)
Alkaline Phosphatase: 52 U/L (ref 38–126)
Anion gap: 9 (ref 5–15)
BUN: 7 mg/dL (ref 6–20)
CO2: 21 mmol/L — ABNORMAL LOW (ref 22–32)
Calcium: 9.2 mg/dL (ref 8.9–10.3)
Chloride: 106 mmol/L (ref 98–111)
Creatinine, Ser: 0.65 mg/dL (ref 0.44–1.00)
GFR, Estimated: >60 mL/min (ref >60)
Glucose, Bld: 110 mg/dL — ABNORMAL HIGH (ref 70–99)
Potassium: 3.3 mmol/L — ABNORMAL LOW (ref 3.5–5.1)
Sodium: 136 mmol/L (ref 135–145)
Total Bilirubin: 0.4 mg/dL (ref 0.3–1.2)
Total Protein: 7.8 g/dL (ref 6.5–8.1)

## 2021-05-15 LAB — HCG, QUANTITATIVE, PREGNANCY: hCG, Beta Chain, Quant, S: 87591 m[IU]/mL — ABNORMAL HIGH (ref <5)

## 2021-05-15 MED ORDER — ONDANSETRON HCL 4 MG/2ML IJ SOLN
4.0000 mg | Freq: Once | INTRAMUSCULAR | Status: AC
  Administered 2021-05-15: 4 mg via INTRAVENOUS
  Filled 2021-05-15: qty 2

## 2021-05-15 MED ORDER — SODIUM CHLORIDE 0.9 % IV BOLUS
1000.0000 mL | Freq: Once | INTRAVENOUS | Status: AC
  Administered 2021-05-15: 1000 mL via INTRAVENOUS

## 2021-05-15 MED ORDER — METRONIDAZOLE 500 MG PO TABS: 500.0000 mg | ORAL_TABLET | Freq: Two times a day (BID) | ORAL | 0 refills | Status: AC

## 2021-05-15 MED ORDER — ONDANSETRON 4 MG PO TBDP: 4.0000 mg | ORAL_TABLET | Freq: Three times a day (TID) | ORAL | 0 refills | Status: AC | PRN

## 2021-05-15 NOTE — ED Provider Notes (Signed)
[facility] EMERGENCY DEPARTMENT Provider Note   CSN: [id number] Arrival date & time: 05/15/21  1211     History Chief Complaint  Patient presents with   Emesis During Pregnancy    Koleen Celia is a 25 y.o. female G1, P0, LMP 03/22/2021 who presents for evaluation of lower abdominal pain.  Began a few days ago.  Pain located diffusely to lower abdomen.  No focal pain.  No vaginal discharge.  She has no concerns for any STDs.  Has also had multiple episodes of NBNB emesis.  Has not taken anything for her symptoms.  No dysuria, hematuria.  No fever, chills, chest pain, shortness of breath, back pain, vaginal bleeding. Does feel constipated. Denies additional rating or alleviating factors.  She has not established with OB/GYN.  History obtained from patient and past medical records.  No interpreter used  HPI     No past medical history on file.  There are no problems to display for this patient.   No past surgical history on file.   OB History     Gravida  1   Para      Term      Preterm      AB      Living         SAB      IAB      Ectopic      Multiple      Live Births              No family history on file.  Social History   Tobacco Use   Smoking status: Former   Smokeless tobacco: Never  Building services engineer Use: Never used  Substance Use Topics   Alcohol use: Yes    Comment: occ   Drug use: Never    Home Medications Prior to Admission medications   Medication Sig Start Date End Date Taking? Authorizing Provider  metroNIDAZOLE (FLAGYL) 500 MG tablet Take 1 tablet (500 mg total) by mouth 2 (two) times daily. 05/15/21  Yes Yamir Carignan A, PA-C  ondansetron (ZOFRAN ODT) 4 MG disintegrating tablet Take 1 tablet (4 mg total) by mouth every 8 (eight) hours as needed for nausea or vomiting. 05/15/21  Yes Ghada Abbett A, PA-C    Allergies    Patient has no known allergies.  Review of Systems   Review of Systems   Constitutional: Negative.   HENT: Negative.    Respiratory: Negative.    Cardiovascular: Negative.   Gastrointestinal:  Positive for abdominal pain, nausea and vomiting. Negative for abdominal distention, anal bleeding, blood in stool, constipation, diarrhea and rectal pain.  Genitourinary: Negative.   Musculoskeletal: Negative.   Skin: Negative.   Neurological: Negative.   All other systems reviewed and are negative.  Physical Exam Updated Vital Signs BP 117/73 (BP Location: Left Arm)   Pulse 68   Temp 99 F (37.2 C) (Oral)   Resp 18   Ht 5\' 4"  (1.626 m)   Wt 68 kg   LMP 03/22/2021 (Exact Date)   SpO2 100%   BMI 25.75 kg/m   Physical Exam Vitals and nursing note reviewed.  Constitutional:      General: She is not in acute distress.    Appearance: She is well-developed. She is not ill-appearing, toxic-appearing or diaphoretic.  HENT:     Head: Normocephalic and atraumatic.     Nose: Nose normal.     Mouth/Throat:     Mouth: Mucous membranes  are moist.  Eyes:     Pupils: Pupils are equal, round, and reactive to light.  Cardiovascular:     Rate and Rhythm: Normal rate.     Pulses: Normal pulses.     Heart sounds: Normal heart sounds.  Pulmonary:     Effort: Pulmonary effort is normal. No respiratory distress.     Breath sounds: Normal breath sounds.  Abdominal:     General: Bowel sounds are normal. There is no distension.     Palpations: Abdomen is soft.     Tenderness: There is abdominal tenderness.     Comments: Diffuse suprapubic tenderness  Genitourinary:    Comments: Normal appearing external female genitalia without rashes or lesions, normal vaginal epithelium. Normal appearing cervix without petechiae. White discharge in vaginal vault. Cervical os is closed. There is no bleeding noted at the os. No dor. Bimanual: No CMT, non-tender.  No palpable adnexal masses or tenderness. Uterus midline and not fixed. Rectovaginal exam was deferred.  No cystocele or  rectocele noted. No pelvic lymphadenopathy noted. Wet prep was obtained.  Cultures for gonorrhea and chlamydia collected. Exam performed with chaperone in room.   Musculoskeletal:        General: Normal range of motion.     Cervical back: Normal range of motion.  Skin:    General: Skin is warm and dry.     Capillary Refill: Capillary refill takes less than 2 seconds.  Neurological:     General: No focal deficit present.     Mental Status: She is alert and oriented to person, place, and time.  Psychiatric:        Mood and Affect: Mood normal.    ED Results / Procedures / Treatments   Labs (all labs ordered are listed, but only abnormal results are displayed) Labs Reviewed  WET PREP, GENITAL - Abnormal; Notable for the following components:      Result Value   Clue Cells Wet Prep HPF POC PRESENT (*)    WBC, Wet Prep HPF POC FEW (*)    All other components within normal limits  CBC WITH DIFFERENTIAL/PLATELET - Abnormal; Notable for the following components:   Platelets 408 (*)    All other components within normal limits  COMPREHENSIVE METABOLIC PANEL - Abnormal; Notable for the following components:   Potassium 3.3 (*)    CO2 21 (*)    Glucose, Bld 110 (*)    All other components within normal limits  URINALYSIS, ROUTINE W REFLEX MICROSCOPIC - Abnormal; Notable for the following components:   Ketones, ur >80 (*)    All other components within normal limits  HCG, QUANTITATIVE, PREGNANCY - Abnormal; Notable for the following components:   hCG, Beta Chain, Quant, S 87,591 (*)    All other components within normal limits  GC/CHLAMYDIA PROBE AMP ([place]) NOT AT [facility]    EKG None  Radiology US OB LESS THAN 14 WEEKS WITH OB TRANSVAGINAL  Result Date: 05/15/2021 CLINICAL DATA:  25 year old pregnant female with pelvic pain. LMP: 03/22/2021 corresponding to an estimated gestational age of [redacted] weeks, 5 days. EXAM: OBSTETRIC <14 WK [place] AND TRANSVAGINAL OB US TECHNIQUE: Both  transabdominal and transvaginal ultrasound examinations were performed for complete evaluation of the gestation as well as the maternal uterus, adnexal regions, and pelvic cul-de-sac. Transvaginal technique was performed to assess early pregnancy. COMPARISON:  None. FINDINGS: Intrauterine gestational sac: Single intrauterine gestational sac. Yolk sac:  Seen Embryo:  Present Cardiac Activity: Detected Heart Rate: 112 bpm CRL:  6 mm   6 w   2 d                  US EDC: 01/06/2022 Subchorionic hemorrhage: Small subchorionic hemorrhage measuring up to 11 mm in length. Maternal uterus/adnexae: The ovaries are poorly visualized. The visualized left ovary is grossly unremarkable. IMPRESSION: Single live intrauterine pregnancy with an estimated gestational age of [redacted] weeks, 2 days. Electronically Signed   By: Elgie CollardArash  Radparvar M.D.   On: 05/15/2021 15:10    Procedures Procedures   Medications Ordered in ED Medications  sodium chloride 0.9 % bolus 1,000 mL (0 mLs Intravenous Stopped 05/15/21 1417)  ondansetron (ZOFRAN) injection 4 mg (4 mg Intravenous Given 05/15/21 1315)    ED Course  I have reviewed the triage vital signs and the nursing notes.  Pertinent labs & imaging results that were available during my care of the patient were reviewed by me and considered in my medical decision making (see chart for details).  Here for evaluation of suprapubic pain in setting of positive home pregnancy test.  She is a G1, P0.  Approximately [redacted] weeks pregnant.  No vaginal bleeding.  No vaginal discharge or concerns for STDs.  Heart and lungs clear.  Abdomen tender to suprapubic region.  GU exam with white discharge in cervical vault.  No CMT or adnexal tenderness plan on labs, imaging and reassess  Labs and imaging personally reviewed and interpreted:  Ultrasound with single IUP approximately 6 weeks CBC without leukocytosis Metabolic panel potassium 3.0, glucose 110 quant hCG 87591 UA negative for infection Wet prep  with BV GC, Chlamydia pending  Patient reassessed. No emesis here in ED. Pending UA.  Reassessed.  Tolerating p.o. intake.  Discussed labs and imaging.  Discussed starting prenatals, meds for BV, OTC medications for emesis and pregnancy.  She will follow-up with OB/GYN.  Will return for new or worsening symptoms.  Patient does not meet the SIRS or Sepsis criteria.  On repeat exam patient does not have a surgical abdomin and there are no peritoneal signs.  No indication of appendicitis, bowel obstruction, bowel perforation, cholecystitis, diverticulitis, PID or ectopic pregnancy.    The patient has been appropriately medically screened and/or stabilized in the ED. I have low suspicion for any other emergent medical condition which would require further screening, evaluation or treatment in the ED or require inpatient management.  Patient is hemodynamically stable and in no acute distress.  Patient able to ambulate in department prior to ED.  Evaluation does not show acute pathology that would require ongoing or additional emergent interventions while in the emergency department or further inpatient treatment.  I have discussed the diagnosis with the patient and answered all questions.  Pain is been managed while in the emergency department and patient has no further complaints prior to discharge.  Patient is comfortable with plan discussed in room and is stable for discharge at this time.  I have discussed strict return precautions for returning to the emergency department.  Patient was encouraged to follow-up with PCP/specialist refer to at discharge.      MDM Rules/Calculators/A&P                            Final Clinical Impression(s) / ED Diagnoses Final diagnoses:  Abdominal pain affecting pregnancy  Bacterial vaginosis    Rx / DC Orders ED Discharge Orders          Ordered  metroNIDAZOLE (FLAGYL) 500 MG tablet  2 times daily        05/15/21 1700    ondansetron (ZOFRAN ODT) 4 MG  disintegrating tablet  Every 8 hours PRN        05/15/21 1700             Mardee Clune A, PA-C 05/15/21 1705    Koleen Distance, MD 05/17/21 [phone]

## 2021-05-15 NOTE — ED Triage Notes (Signed)
Pt with positive pregnancy test last week. Been having vomiting and abdominal pain this week. Unable to tolerate PO

## 2021-05-15 NOTE — Discharge Instructions (Addendum)
Ultrasound showed a 6-week pregnancy with baby in the correct location  Your wet prep swabs do show with called bacterial vaginosis.  This is not an STD.  This is is an overgrowth of the normal bacteria that lives in the vagina.  I have written you for some meds to help with this  Follow-up with OB/GYN.  Start taking prenatal vitamins  I have written you for a few medications to help with your nausea.

## 2021-05-15 NOTE — ED Notes (Signed)
Pt ambulatory to waiting room. Pt verbalized understanding of discharge instructions.   

## 2021-05-15 NOTE — ED Notes (Signed)
Pt reports N/V with suprapubic abdominal pain that "comes and goes." Pt does not report any precipitating factors.

## 2021-05-17 LAB — GC/CHLAMYDIA PROBE AMP (~~LOC~~) NOT AT ARMC
Chlamydia: NEGATIVE
Comment: NEGATIVE
Comment: NORMAL
Neisseria Gonorrhea: NEGATIVE

## 2021-11-29 ENCOUNTER — Ambulatory Visit: Payer: Self-pay

## 2021-11-29 NOTE — Telephone Encounter (Signed)
°  Chief Complaint: contractions Symptoms: contractions that are regular but pt hadnt timed for length and freq.  Frequency: today Pertinent Negatives: NA Disposition: [x] ED /[] Urgent Care (no appt availability in office) / [] Appointment(In office/virtual)/ []  [place] [facility]/ [] Home Care/ [] Refused Recommended Disposition /[] [place] [facility]/ []  Follow-up with PCP Additional Notes:    Reason for Disposition  MODERATE-SEVERE abdominal pain  Answer Assessment - Initial Assessment Questions 1. ONSET: "When did the symptoms begin?"        Earlier today 12 noon 2. CONTRACTIONS: "Describe the contractions that you are having." (e.g., duration, frequency, regularity, severity)     Regular, unsure of duration and frequency  3. EDD: "What date are you expecting to deliver?"     12/28/21 4. PARITY: "Have you had a baby before?" If Yes, ask: "How long did the labor last?"     1st baby 5. FETAL MOVEMENT: "Has the baby's movement decreased or changed significantly from normal?"     Decreased movement  6. OTHER SYMPTOMS: "Do you have any other symptoms?" (e.g., leaking fluid from vagina, fever, hand/facial swelling)     No  Protocols used: Pregnancy - Labor - Preterm-A-AH

## 2023-07-14 IMAGING — US US OB < 14 WEEKS - US OB TV
1 series · 14 of 28 positions shown · non-contrast
Comparison: None.

CLINICAL DATA: 25-year-old pregnant female with pelvic pain. LMP:
03/22/2021 corresponding to an estimated gestational age of 7 weeks,
5 days.

EXAM:
OBSTETRIC <14 WK US AND TRANSVAGINAL OB US
TECHNIQUE: Both transabdominal and transvaginal ultrasound examinations were
performed for complete evaluation of the gestation as well as the
maternal uterus, adnexal regions, and pelvic cul-de-sac.
Transvaginal technique was performed to assess early pregnancy.

[Series 1: us ob < 14 weeks - us ob tv · 14 of 50 slices shown]
[im 2/50]
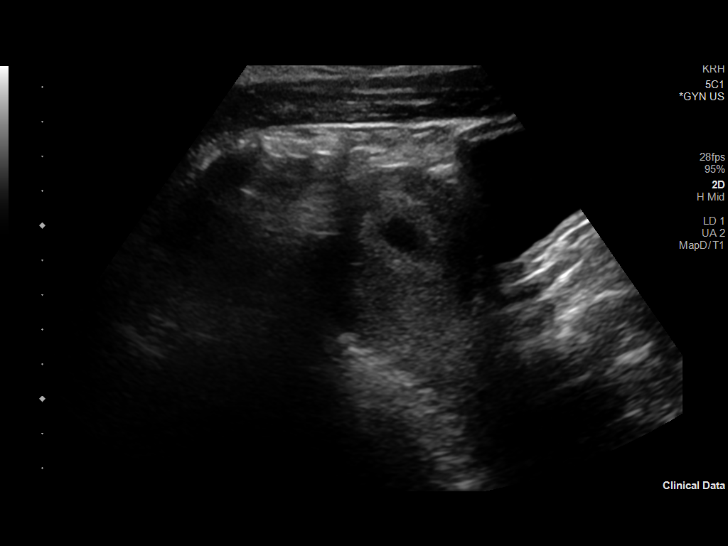
[im 6/50]
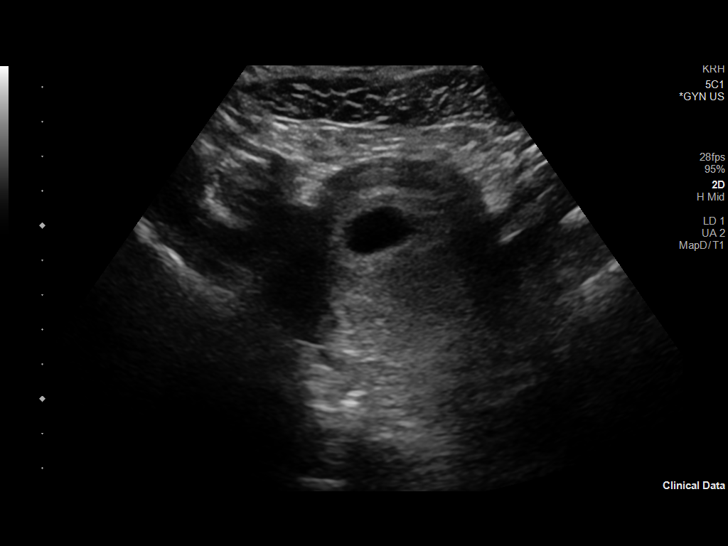
[im 10/50]
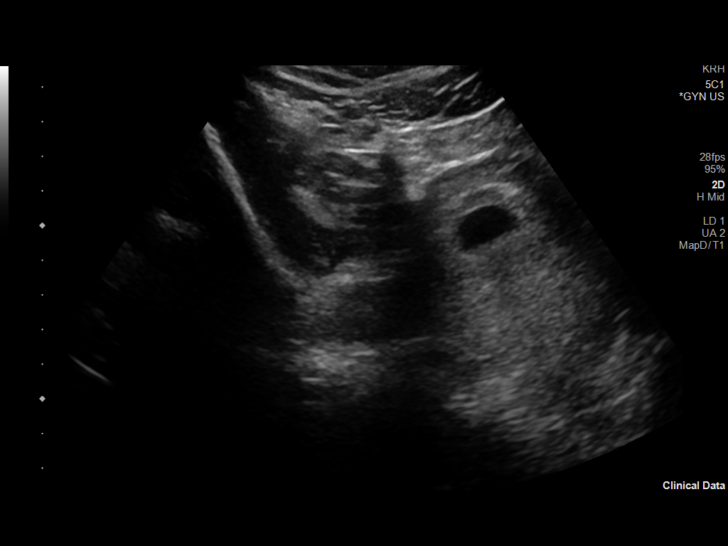
[im 13/50]
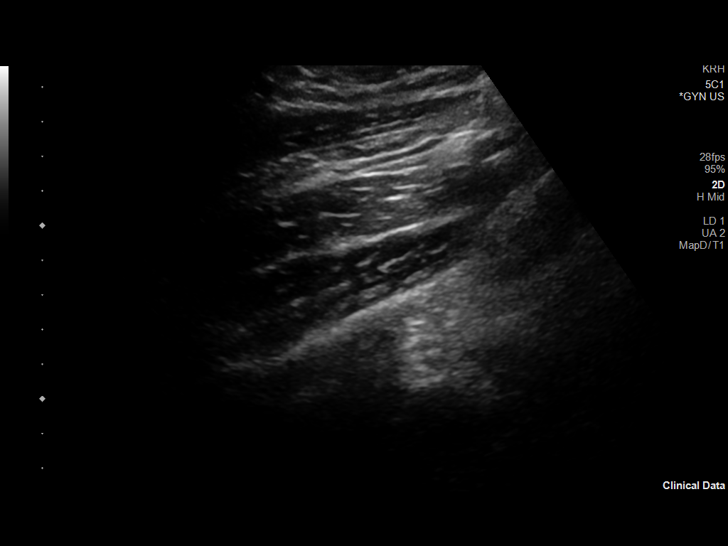
[im 17/50]
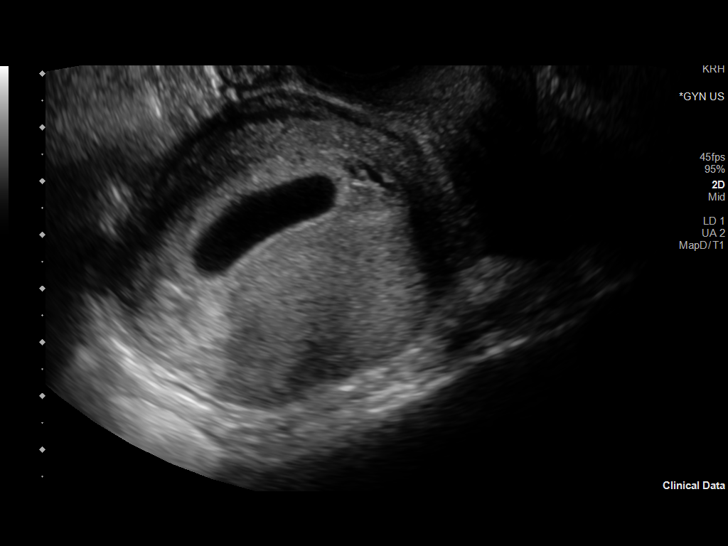
[im 20/50]
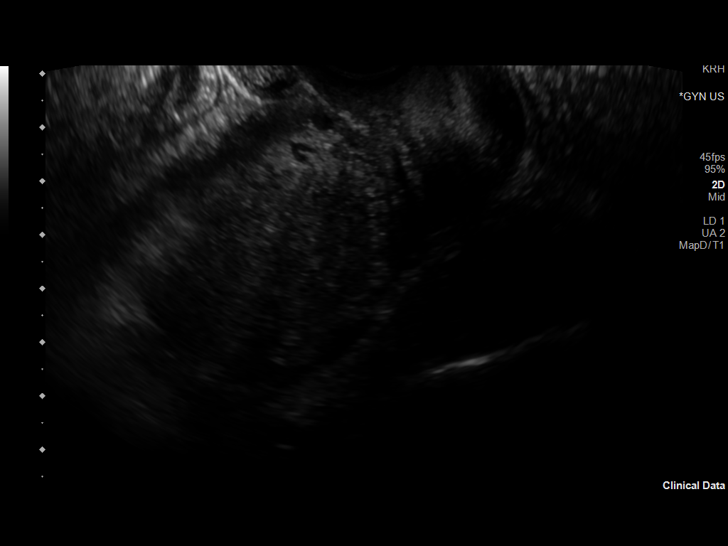
[im 24/50]
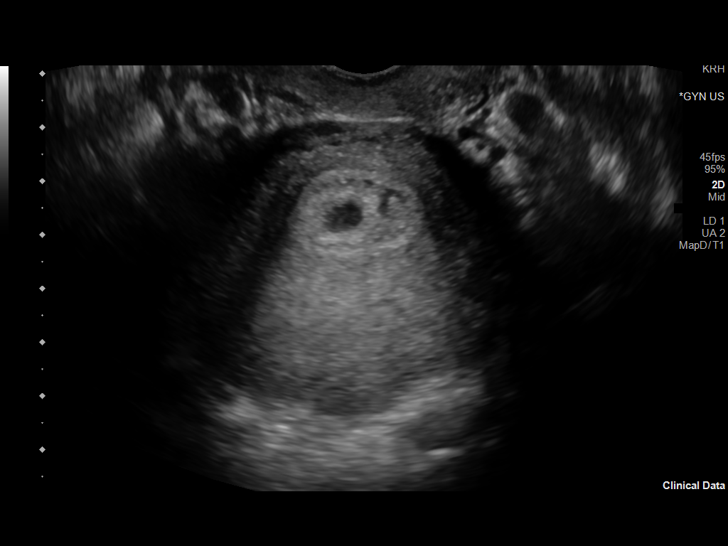
[im 28/50]
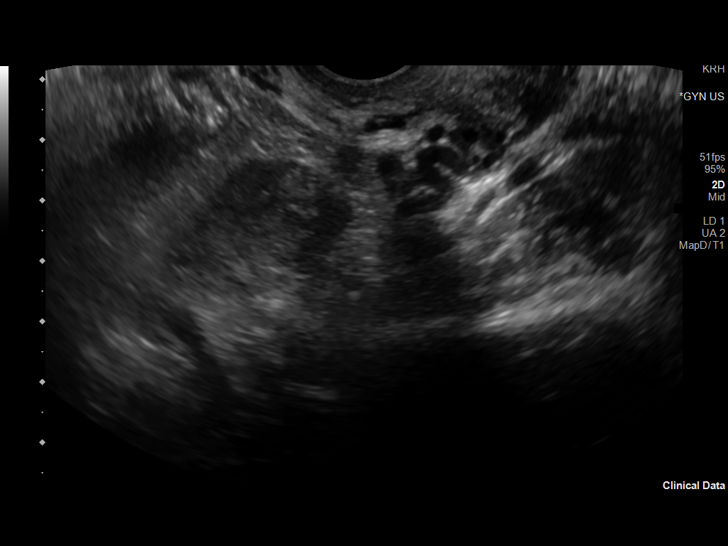
[im 31/50]
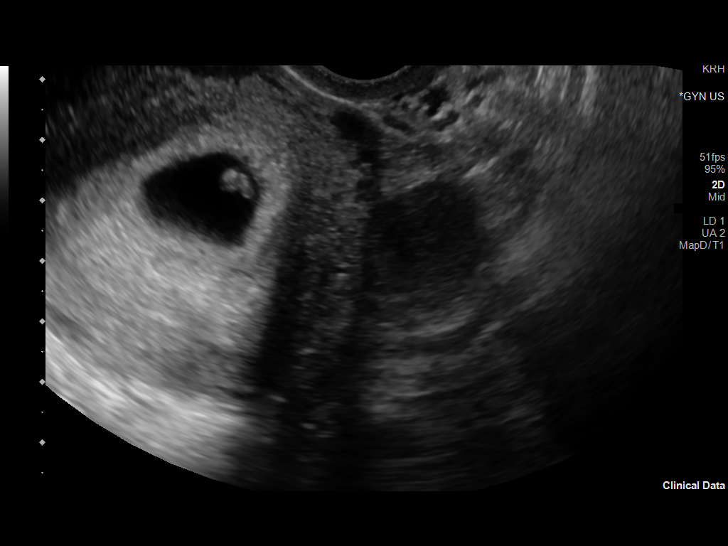
[im 35/50]
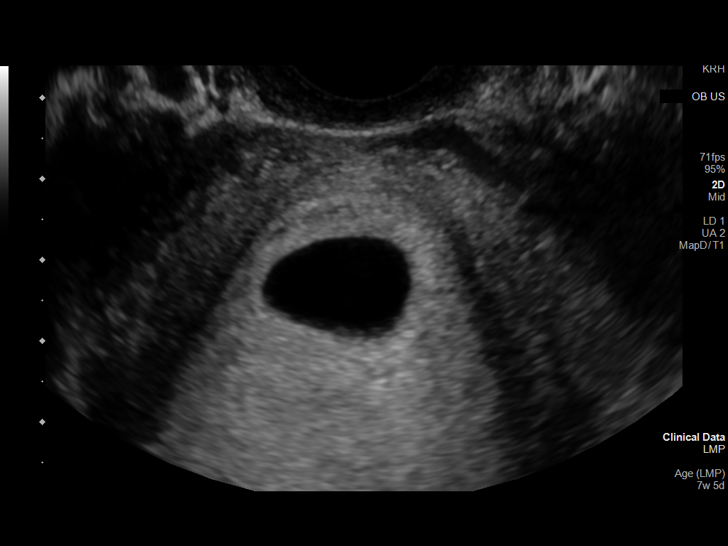
[im 39/50]
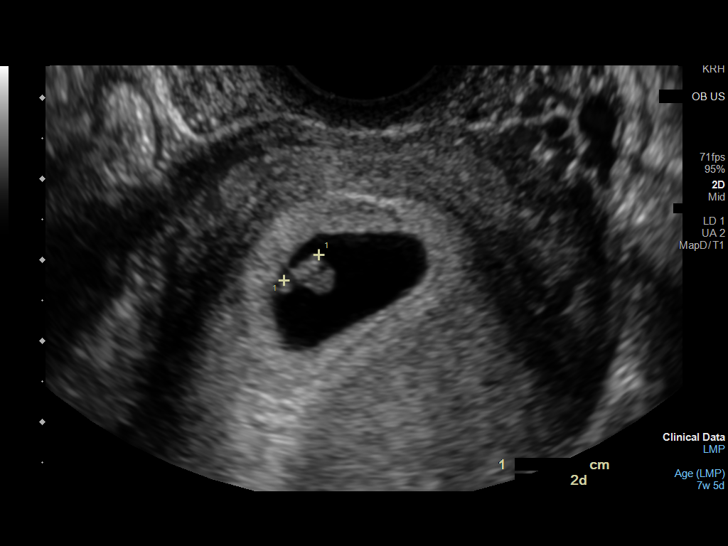
[im 42/50]
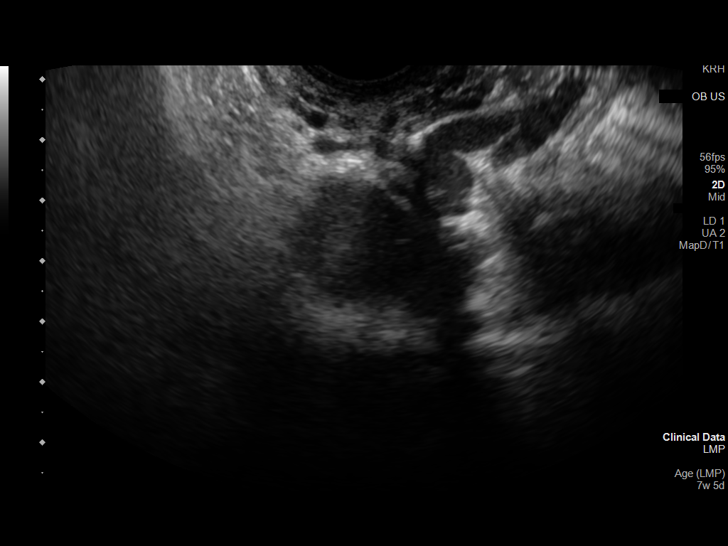
[im 46/50]
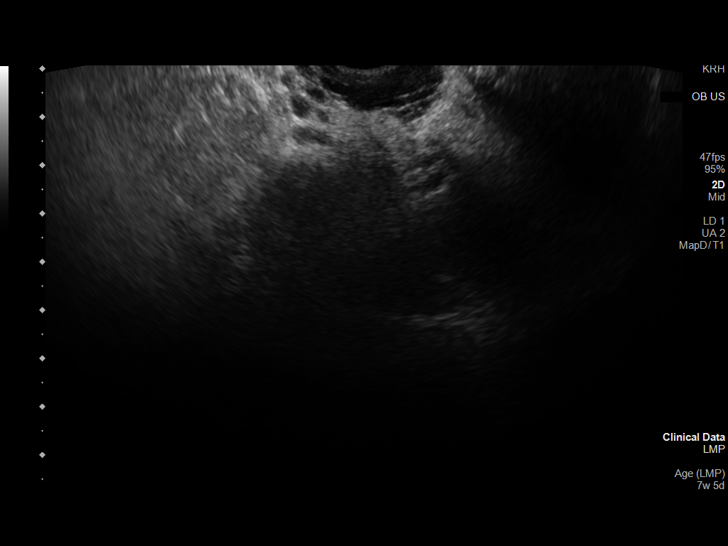
[im 50/50]
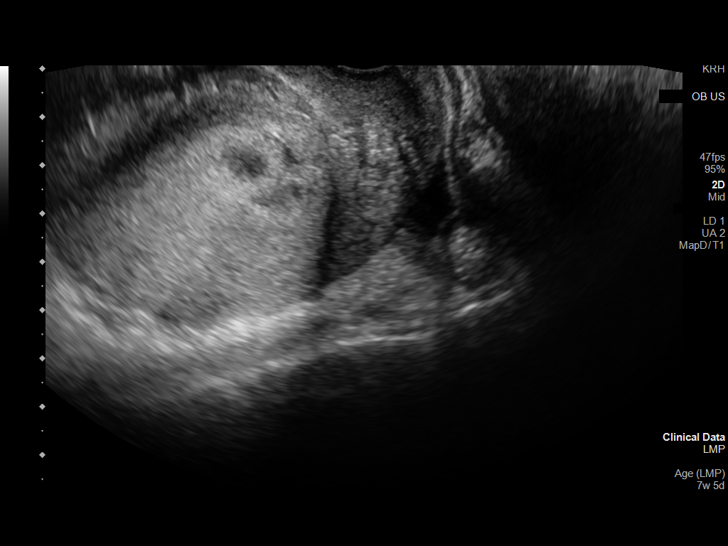

[14 of 28 positions shown; findings below may reference images not displayed]

FINDINGS: Intrauterine gestational sac: Single intrauterine gestational sac.

Yolk sac:  Seen

Embryo:  Present

Cardiac Activity: Detected

Heart Rate: 112 bpm

CRL: 6 mm   6 w   2 d                  US EDC: 01/06/2022

Subchorionic hemorrhage: Small subchorionic hemorrhage measuring up
to 11 mm in length.

Maternal uterus/adnexae: The ovaries are poorly visualized. The
visualized left ovary is grossly unremarkable.
IMPRESSION: Single live intrauterine pregnancy with an estimated gestational age
of 6 weeks, 2 days.
